# Patient Record
Sex: Female | Born: 1935
Health system: Southern US, Community
[De-identification: ages and names within clinical notes are randomized; demographics above are authoritative.]

## PROBLEM LIST (undated history)

## (undated) DIAGNOSIS — R000 Tachycardia, unspecified: Secondary | ICD-10-CM

## (undated) DIAGNOSIS — J309 Allergic rhinitis, unspecified: Secondary | ICD-10-CM

## (undated) DIAGNOSIS — C439 Malignant melanoma of skin, unspecified: Secondary | ICD-10-CM

## (undated) DIAGNOSIS — R55 Syncope and collapse: Secondary | ICD-10-CM

## (undated) DIAGNOSIS — E039 Hypothyroidism, unspecified: Secondary | ICD-10-CM

## (undated) HISTORY — DX: Malignant melanoma of skin, unspecified: C43.9

## (undated) HISTORY — DX: Hypothyroidism, unspecified: E03.9

## (undated) HISTORY — DX: Tachycardia, unspecified: R00.0

## (undated) HISTORY — DX: Allergic rhinitis, unspecified: J30.9

## (undated) HISTORY — DX: Syncope and collapse: R55

---

## 1995-06-29 DIAGNOSIS — C439 Malignant melanoma of skin, unspecified: Secondary | ICD-10-CM

## 1995-06-29 HISTORY — PX: MELANOMA EXCISION: SHX5266

## 1995-06-29 HISTORY — DX: Malignant melanoma of skin, unspecified: C43.9

## 2011-02-25 ENCOUNTER — Encounter: Payer: Self-pay | Admitting: Cardiovascular Disease

## 2011-08-19 DIAGNOSIS — R5383 Other fatigue: Secondary | ICD-10-CM | POA: Diagnosis not present

## 2011-08-19 DIAGNOSIS — I499 Cardiac arrhythmia, unspecified: Secondary | ICD-10-CM | POA: Diagnosis not present

## 2011-08-19 DIAGNOSIS — R5381 Other malaise: Secondary | ICD-10-CM | POA: Diagnosis not present

## 2011-08-19 DIAGNOSIS — M549 Dorsalgia, unspecified: Secondary | ICD-10-CM | POA: Diagnosis not present

## 2011-08-19 DIAGNOSIS — E039 Hypothyroidism, unspecified: Secondary | ICD-10-CM | POA: Diagnosis not present

## 2011-08-19 DIAGNOSIS — Z9181 History of falling: Secondary | ICD-10-CM | POA: Diagnosis not present

## 2011-08-20 ENCOUNTER — Encounter: Payer: Self-pay | Admitting: Cardiovascular Disease

## 2011-08-20 ENCOUNTER — Ambulatory Visit (INDEPENDENT_AMBULATORY_CARE_PROVIDER_SITE_OTHER): Payer: Medicare Other | Admitting: Cardiovascular Disease

## 2011-08-20 ENCOUNTER — Ambulatory Visit (HOSPITAL_COMMUNITY): Payer: Medicare Other | Attending: Cardiology

## 2011-08-20 ENCOUNTER — Other Ambulatory Visit: Payer: Self-pay

## 2011-08-20 VITALS — BP 144/66 | HR 104 | Ht 64.0 in | Wt 104.8 lb

## 2011-08-20 DIAGNOSIS — R000 Tachycardia, unspecified: Secondary | ICD-10-CM

## 2011-08-20 DIAGNOSIS — I379 Nonrheumatic pulmonary valve disorder, unspecified: Secondary | ICD-10-CM | POA: Insufficient documentation

## 2011-08-20 DIAGNOSIS — I319 Disease of pericardium, unspecified: Secondary | ICD-10-CM | POA: Insufficient documentation

## 2011-08-20 DIAGNOSIS — R55 Syncope and collapse: Secondary | ICD-10-CM

## 2011-08-20 DIAGNOSIS — I080 Rheumatic disorders of both mitral and aortic valves: Secondary | ICD-10-CM | POA: Insufficient documentation

## 2011-08-20 DIAGNOSIS — I079 Rheumatic tricuspid valve disease, unspecified: Secondary | ICD-10-CM | POA: Insufficient documentation

## 2011-08-20 DIAGNOSIS — I498 Other specified cardiac arrhythmias: Secondary | ICD-10-CM | POA: Diagnosis not present

## 2011-08-20 HISTORY — DX: Syncope and collapse: R55

## 2011-08-20 LAB — BASIC METABOLIC PANEL
BUN: 12 mg/dL (ref 6–23)
CO2: 26 mEq/L (ref 19–32)
Calcium: 8.8 mg/dL (ref 8.4–10.5)
Chloride: 102 mEq/L (ref 96–112)
Creatinine, Ser: 0.5 mg/dL (ref 0.4–1.2)
GFR: 114.47 mL/min (ref >60.00)
Glucose, Bld: 75 mg/dL (ref 70–99)
Potassium: 3.7 mEq/L (ref 3.5–5.1)
Sodium: 135 mEq/L (ref 135–145)

## 2011-08-20 LAB — TSH: TSH: 1.94 u[IU]/mL (ref 0.35–5.50)

## 2011-08-20 NOTE — Patient Instructions (Signed)
Your physician recommends that you schedule a follow-up appointment in: 3 months.  Labs today:  TSH, BMP  Your physician has requested that you have an echocardiogram. Echocardiography is a painless test that uses sound waves to create images of your heart. It provides your doctor with information about the size and shape of your heart and how well your heart's chambers and valves are working. This procedure takes approximately one hour. There are no restrictions for this procedure.  Your physician has recommended that you wear an event monitor. Event monitors are medical devices that record the heart's electrical activity. Doctors most often [place] these monitors to diagnose arrhythmias. Arrhythmias are problems with the speed or rhythm of the heartbeat. The monitor is a small, portable device. You can wear one while you do your normal daily activities. This is usually used to diagnose what is causing palpitations/syncope (passing out).

## 2011-08-20 NOTE — Assessment & Plan Note (Signed)
[name] presents today for further evaluation of a syncopal episode. She passed out while standing in the kitchen. Her symptoms do not sound consistent with orthostasis.  We'll check an echocardiogram on her for further evaluation. We'll check labs including a TSH and basic metabolic profile. We'll place a [employer] monitor on her.  I will see her in 3 months for followup visit.

## 2011-08-20 NOTE — Assessment & Plan Note (Signed)
Wilfred presents with a fast heart rate today. Have received some labs from her medical doctor's office. We will add a TSH to the labs. We'll also recheck a basic profile

## 2011-08-20 NOTE — Progress Notes (Signed)
    Rush Landmark Date of Birth  05/22/1926 [facility]     [place] [facility]  [address]    [address]   [address] [place], [place]  [address]    [place], [place]  [address] [phone]  Fax  [phone]  [phone]  Fax [phone]  Problem List: 1. Syncope  History of Present Illness:  Patricia Gilbert is an 78 yo  Who we are asked to see after having an episode of syncope. She denies any dyspnea or chest pain.  No hx of HTN.  She is very active - walks every day.   She had been standing for a while in the kitchen.  She has not have any problems since then.  Current Outpatient Prescriptions  Medication Sig Dispense Refill  . levothyroxine (SYNTHROID, LEVOTHROID) 50 MCG tablet Take 50 mcg by mouth daily.        Allergies  Allergen Reactions  . Aspirin     No past medical history on file.  Past Surgical History  Procedure Date  . Cesarean section     [id number]  . Melanoma excision 1997    History  Smoking status  . Never Smoker   Smokeless tobacco  . Not on file    History  Alcohol Use: Not on file    History reviewed. No pertinent family history.  Reviw of Systems:  Reviewed in the HPI.  All other systems are negative.  Physical Exam: Blood pressure 144/66, pulse 104, height 5\' 4"  (1.626 m), weight 104 lb 12.8 oz (47.537 kg). General: Well developed, well nourished, in no acute distress.  Head: Normocephalic, atraumatic, sclera non-icteric, mucus membranes are moist,   Neck: Supple. Carotids are 2 + without bruits. No JVD  Lungs: Clear bilaterally to auscultation.  Heart: regular rate  With normal  S1 S2. No murmurs, gallops or rubs.  Abdomen: Soft, non-tender, non-distended with normal bowel sounds. No hepatomegaly. No rebound/guarding. No masses.  Msk:  Strength and tone are normal  Extremities: No clubbing or cyanosis. No edema.  Distal pedal pulses are 2+ and equal bilaterally.  Neuro: Alert and oriented X 3. Moves all  extremities spontaneously.  Psych:  Responds to questions appropriately with a normal affect.  ECG: Normal sinus rhythm. She has occasional premature ventricular contractions. There is poor R-wave progression I suspect is due to lead placementx  Assessment / Plan:

## 2011-08-23 ENCOUNTER — Telehealth: Payer: Self-pay | Admitting: Cardiovascular Disease

## 2011-08-23 NOTE — Telephone Encounter (Signed)
New Msg: Pt calling wanting to speak with nurse/MD regarding results of pt lab results and ECHO.   Please return pt call to discuss further.

## 2011-08-23 NOTE — Telephone Encounter (Signed)
Pt called with echo and lab results. Pt verbalized understanding. Alfonso Ramus RN

## 2011-08-25 ENCOUNTER — Telehealth: Payer: Self-pay | Admitting: *Deleted

## 2011-08-25 NOTE — Telephone Encounter (Signed)
Patient called with echo results. Pt verbalized understanding. Jodette Onyekachi Gathright RN  

## 2011-09-18 DIAGNOSIS — R55 Syncope and collapse: Secondary | ICD-10-CM | POA: Diagnosis not present

## 2011-09-21 ENCOUNTER — Telehealth: Payer: Self-pay | Admitting: *Deleted

## 2011-09-21 NOTE — Telephone Encounter (Signed)
CALLED PT WITH ECARDIO RESULTS OF PAC/ PVC, NO REASONS TO EXPLAIN PRIOR SYNCOPAL EPISODE

## 2011-09-25 ENCOUNTER — Encounter: Payer: Self-pay | Admitting: Cardiovascular Disease

## 2011-10-19 ENCOUNTER — Telehealth: Payer: Self-pay | Admitting: Cardiovascular Disease

## 2011-10-19 NOTE — Telephone Encounter (Signed)
ROI Mailed to Pt  10/19/11/KM

## 2011-10-26 ENCOUNTER — Telehealth: Payer: Self-pay | Admitting: Cardiovascular Disease

## 2011-10-26 ENCOUNTER — Telehealth: Payer: Self-pay | Admitting: *Deleted

## 2011-10-26 NOTE — Telephone Encounter (Signed)
Pt walked in thinking she had app for a treadmill, I reviewed chart and see where originally there was an order for a monitor placed under the treadmill heading but an ecardio monitor was placed the day of app. Pt has not had any more syncopal episodes and has been feeling fine, ecardio showed PAC/ PVC.  I apologized for confusion and the long trip in from [place], pt denied need for sooner app with dr Elease Hashimoto, told to call if further episodes and I will get her in sooner than her scheduled 3 month app, pt agreed to plan.

## 2011-10-26 NOTE — Telephone Encounter (Signed)
Patient signed authorization form. Gave her labs.4/30 emg

## 2011-11-25 ENCOUNTER — Encounter: Payer: Self-pay | Admitting: Cardiovascular Disease

## 2011-11-25 ENCOUNTER — Ambulatory Visit (INDEPENDENT_AMBULATORY_CARE_PROVIDER_SITE_OTHER): Payer: Medicare Other | Admitting: Cardiovascular Disease

## 2011-11-25 VITALS — BP 130/56 | HR 80 | Ht 64.0 in | Wt 104.8 lb

## 2011-11-25 DIAGNOSIS — I498 Other specified cardiac arrhythmias: Secondary | ICD-10-CM | POA: Diagnosis not present

## 2011-11-25 DIAGNOSIS — R55 Syncope and collapse: Secondary | ICD-10-CM

## 2011-11-25 DIAGNOSIS — R000 Tachycardia, unspecified: Secondary | ICD-10-CM

## 2011-11-25 NOTE — Patient Instructions (Signed)
Your physician recommends that you schedule a follow-up appointment in: as needed basis, please follow up with Dr Felicity Coyer. Call any time you need further cardiac care, thank you!

## 2011-11-25 NOTE — Progress Notes (Signed)
    Rush Landmark Date of Birth  December 14, 1925 [facility]     [place] [facility]  [address]    [address]   [address] [place], [place]  [address]    [place], [place]  [address] [phone]  Fax  [phone]  [phone]  Fax [phone]  Problem List: 1. Syncope  History of Present Illness:  Patricia Gilbert is an 78 yo  Who we are asked to see after having an episode of syncope. She denies any dyspnea or chest pain.  No hx of HTN.  She is very active - walks every day.   She had been standing for a while in the kitchen.  She has not have any problems since then.  An echo revealed:  Left ventricle: The cavity size was normal. Wall thickness was normal. Systolic function was normal. The estimated ejection fraction was in the range of 55% to 60%. Wall motion was normal; there were no regional wall motion abnormalities. Doppler parameters are consistent with abnormal left ventricular relaxation (grade 1 diastolic dysfunction). - Aortic valve: Mild regurgitation. - Mitral valve: Mild prolapse, involving the posterior leaflet. Mild regurgitation. - Tricuspid valve: Moderate regurgitation. - Pulmonary arteries: Systolic pressure was mildly increased. PA peak pressure: 42mm Hg (S). - Pericardium, extracardiac: A small pericardial effusion was identified.  She walks 2 miles a day.   She's not had any other problems. She denies any further episodes of syncope. She is scheduled to see Dr. Felicity Coyer soon.   Current Outpatient Prescriptions  Medication Sig Dispense Refill  . levothyroxine (SYNTHROID, LEVOTHROID) 50 MCG tablet Take 50 mcg by mouth daily.        Allergies  Allergen Reactions  . Aspirin     No past medical history on file.  Past Surgical History  Procedure Date  . Cesarean section     [id number]  . Melanoma excision 1997    History  Smoking status  . Never Smoker   Smokeless tobacco  . Not on file    History  Alcohol Use: Not on file     No family history on file.  Reviw of Systems:  Reviewed in the HPI.  All other systems are negative.  Physical Exam: Blood pressure 130/56, pulse 80, height 5\' 4"  (1.626 m), weight 104 lb 12.8 oz (47.537 kg). General: Well developed, well nourished, in no acute distress.  Head: Normocephalic, atraumatic, sclera non-icteric, mucus membranes are moist,   Neck: Supple. Carotids are 2 + without bruits. No JVD  Lungs: Clear bilaterally to auscultation.  Heart: regular rate  With normal  S1 S2. No murmurs, gallops or rubs.  Abdomen: Soft, non-tender, non-distended with normal bowel sounds. No hepatomegaly. No rebound/guarding. No masses.  Msk:  Strength and tone are normal  Extremities: No clubbing or cyanosis. No edema.  Distal pedal pulses are 2+ and equal bilaterally.  Neuro: Alert and oriented X 3. Moves all extremities spontaneously.  Psych:  Responds to questions appropriately with a normal affect.  ECG:  Assessment / Plan:

## 2011-11-25 NOTE — Assessment & Plan Note (Signed)
Her heart rate is 80. We'll continue with her same medications. I'll see her on an as-needed basis.

## 2011-11-25 NOTE — Assessment & Plan Note (Signed)
Her echocardiogram was normal. She's not had any specific arrhythmias. We will see her on an as-needed basis. She'll be seeing Dr. Felicity Coyer and can see [place] on an as needed basis.

## 2011-11-27 ENCOUNTER — Encounter: Payer: Self-pay | Admitting: Cardiovascular Disease

## 2011-12-06 DIAGNOSIS — Z85828 Personal history of other malignant neoplasm of skin: Secondary | ICD-10-CM | POA: Diagnosis not present

## 2011-12-06 DIAGNOSIS — L570 Actinic keratosis: Secondary | ICD-10-CM | POA: Diagnosis not present

## 2011-12-06 DIAGNOSIS — D485 Neoplasm of uncertain behavior of skin: Secondary | ICD-10-CM | POA: Diagnosis not present

## 2011-12-06 DIAGNOSIS — Z85820 Personal history of malignant melanoma of skin: Secondary | ICD-10-CM | POA: Diagnosis not present

## 2011-12-24 ENCOUNTER — Encounter: Payer: Self-pay | Admitting: Internal Medicine

## 2011-12-24 ENCOUNTER — Ambulatory Visit (INDEPENDENT_AMBULATORY_CARE_PROVIDER_SITE_OTHER): Payer: Medicare Other | Admitting: Internal Medicine

## 2011-12-24 VITALS — BP 120/62 | HR 77 | Temp 98.1°F | Ht 64.0 in | Wt 103.8 lb

## 2011-12-24 DIAGNOSIS — Z0000 Encounter for general adult medical examination without abnormal findings: Secondary | ICD-10-CM

## 2011-12-24 DIAGNOSIS — E039 Hypothyroidism, unspecified: Secondary | ICD-10-CM | POA: Diagnosis not present

## 2011-12-24 DIAGNOSIS — F0390 Unspecified dementia without behavioral disturbance: Secondary | ICD-10-CM

## 2011-12-24 DIAGNOSIS — J309 Allergic rhinitis, unspecified: Secondary | ICD-10-CM | POA: Insufficient documentation

## 2011-12-24 MED ORDER — LEVOTHYROXINE SODIUM 50 MCG PO TABS: 50.0000 ug | ORAL_TABLET | Freq: Every day | ORAL | Status: DC

## 2011-12-24 MED ORDER — CALCIUM CARBONATE ANTACID 500 MG PO CHEW: 2.0000 | CHEWABLE_TABLET | Freq: Two times a day (BID) | ORAL | Status: AC

## 2011-12-24 MED ORDER — VITAMIN D 1000 UNITS PO TABS: 1000.0000 [IU] | ORAL_TABLET | Freq: Every day | ORAL | Status: AC

## 2011-12-24 NOTE — Progress Notes (Signed)
Subjective:    Patient ID: Patricia Gilbert, female    DOB: 02-03-1926, 78 y.o.   MRN: [id number]  HPI  New pt to me and our division, here to establish care Describes herself as a "health nut" - walks 36miles/day and active with bridge club, painting (art)   Also here for medicare wellness  Diet: heart healthy Physical activity: WB/active walking daily Depression/mood screen: negative Hearing: intact to whispered voice Visual acuity: grossly normal, performs annual eye exam  ADLs: capable Fall risk: none Home safety: good Cognitive evaluation: intact to orientation, naming, and repetition - poor recall EOL planning: adv directives, full code/ I agree  I have personally reviewed and have noted 1. The patient's medical and social history 2. Their use of alcohol, tobacco or illicit drugs 3. Their current medications and supplements 4. The patient's functional ability including ADL's, fall risks, home safety risks and hearing or visual impairment. 5. Diet and physical activities 6. Evidence for depression or mood disorders  Reviewed chronic medical issues:  Hypothyroid - the patient reports compliance with medication(s) as prescribed. Denies adverse side effects.  Past Medical History  Diagnosis Date  . Sinus tachycardia   . Syncope 08/20/2011    single event, neg echo and cards eval   . Allergic rhinitis, cause unspecified   . Hypothyroid   . Melanoma 1997   Family History  Problem Relation Age of Onset  . Prostate cancer Father   . Diabetes Other   . Stroke Other    History  Substance Use Topics  . Smoking status: Never Smoker   . Smokeless tobacco: Not on file  . Alcohol Use: Yes     Review of Systems Constitutional: Negative for fever or weight change.  Respiratory: Negative for cough and shortness of breath.   Cardiovascular: Negative for chest pain or palpitations.  Gastrointestinal: Negative for abdominal pain, no bowel changes.  Musculoskeletal:  Negative for gait problem or joint swelling.  Skin: Negative for rash.  Neurological: Negative for dizziness or headache. mild memory problems No other specific complaints in a complete review of systems (except as listed in HPI above).     Objective:   Physical Exam BP 120/62  Pulse 77  Temp 98.1 F (36.7 C) (Oral)  Ht 5\' 4"  (1.626 m)  Wt 103 lb 12.8 oz (47.083 kg)  BMI 17.82 kg/m2  SpO2 96% Wt Readings from Last 3 Encounters:  12/24/11 103 lb 12.8 oz (47.083 kg)  11/25/11 104 lb 12.8 oz (47.537 kg)  08/20/11 104 lb 12.8 oz (47.537 kg)   Constitutional: She appears well-developed and well-nourished. No distress.  HENT: Head: Normocephalic and atraumatic. Ears: B TMs ok, no erythema or effusion; Nose: Nose normal. Mouth/Throat: Oropharynx is clear and moist. No oropharyngeal exudate.  Eyes: wears corrective lenses. Conjunctivae and EOM are normal. Pupils are equal, round, and reactive to light. No scleral icterus.  Neck: Normal range of motion. Neck supple. No JVD present. No thyromegaly present.  Cardiovascular: Normal rate, regular rhythm and normal heart sounds.  No murmur heard. No BLE edema. Pulmonary/Chest: Effort normal and breath sounds normal. No respiratory distress. She has no wheezes.  Musculoskeletal: Normal range of motion, no joint effusions. No gross deformities Neurological: Mild head/neck tremor (ET). She is alert and oriented to person, place, and time. Mild short term memory deficits noted (MSE 28/30). No cranial nerve deficit. Coordination, articulation and speech content normal.  Skin: well healed excision on RLE from prior melanoma - Skin is warm and  dry. No rash noted. No erythema.  Psychiatric: She has a normal mood and affect. Her behavior is normal. Judgment and thought content normal.   Lab Results  Component Value Date   GLUCOSE 75 08/20/2011   NA 135 08/20/2011   K 3.7 08/20/2011   CL 102 08/20/2011   CREATININE 0.5 08/20/2011   BUN 12 08/20/2011   CO2  26 08/20/2011   TSH 1.94 08/20/2011       Assessment & Plan:  AWV/v70.0 - Today patient counseled on age appropriate routine health concerns for screening and prevention, each reviewed and up to date or declined. Immunizations reviewed and up to date or declined. Labs/ECG from 07/2011 reviewed. Risk factors for depression reviewed and negative. Hearing function and visual acuity are intact. ADLs screened and addressed as needed. Functional ability and level of safety reviewed and appropriate. Education, counseling and referrals performed based on assessed risks today. Patient provided with a copy of personalized plan for preventive services.

## 2011-12-24 NOTE — Assessment & Plan Note (Signed)
Lab Results  Component Value Date   TSH 1.94 08/20/2011   The current medical regimen is effective;  continue present plan and medications.

## 2011-12-24 NOTE — Patient Instructions (Addendum)
It was good to see you today. We have reviewed your prior records including labs and tests today Medications reviewed, no changes at this time. Refill on medication(s) as discussed today. Take 1 can Boost each day in addition to your regular meals Take Tums for calcium and Vitamin D as discussed Health Maintenance reviewed - all recommended immunizations and age-appropriate screenings are up-to-date or declined.  Please schedule followup in 1 year for thyroid check, call sooner if problems.

## 2011-12-24 NOTE — Assessment & Plan Note (Signed)
Evident on exam and MSE today (28/30) Discussed option of med tx to slow process and pt declines same Pt declines med eval for reversible causes -  Will monitor and provide support ongoing

## 2012-05-10 DIAGNOSIS — Z9189 Other specified personal risk factors, not elsewhere classified: Secondary | ICD-10-CM | POA: Diagnosis not present

## 2012-05-10 DIAGNOSIS — C44320 Squamous cell carcinoma of skin of unspecified parts of face: Secondary | ICD-10-CM | POA: Diagnosis not present

## 2012-05-10 DIAGNOSIS — D485 Neoplasm of uncertain behavior of skin: Secondary | ICD-10-CM | POA: Diagnosis not present

## 2012-05-10 DIAGNOSIS — Z85820 Personal history of malignant melanoma of skin: Secondary | ICD-10-CM | POA: Diagnosis not present

## 2012-05-10 DIAGNOSIS — D0439 Carcinoma in situ of skin of other parts of face: Secondary | ICD-10-CM | POA: Diagnosis not present

## 2012-05-10 DIAGNOSIS — L570 Actinic keratosis: Secondary | ICD-10-CM | POA: Diagnosis not present

## 2012-05-10 DIAGNOSIS — Z85828 Personal history of other malignant neoplasm of skin: Secondary | ICD-10-CM | POA: Diagnosis not present

## 2012-05-29 ENCOUNTER — Other Ambulatory Visit: Payer: Self-pay | Admitting: *Deleted

## 2012-05-29 MED ORDER — LEVOTHYROXINE SODIUM 50 MCG PO TABS: 50.0000 ug | ORAL_TABLET | Freq: Every day | ORAL | Status: DC

## 2012-05-29 NOTE — Telephone Encounter (Signed)
R'cd fax from [facility] for refill of Synthroid.

## 2012-06-01 DIAGNOSIS — D0439 Carcinoma in situ of skin of other parts of face: Secondary | ICD-10-CM | POA: Diagnosis not present

## 2012-06-01 DIAGNOSIS — C44320 Squamous cell carcinoma of skin of unspecified parts of face: Secondary | ICD-10-CM | POA: Diagnosis not present

## 2012-12-27 ENCOUNTER — Other Ambulatory Visit: Payer: Self-pay | Admitting: *Deleted

## 2012-12-27 NOTE — Telephone Encounter (Signed)
Pt called in for a refill on Levothyroxine, advised appoint needed in order to refill medication.  Appointment made for 7.9.14.

## 2013-01-03 ENCOUNTER — Ambulatory Visit (INDEPENDENT_AMBULATORY_CARE_PROVIDER_SITE_OTHER)
Admission: RE | Admit: 2013-01-03 | Discharge: 2013-01-03 | Disposition: A | Discharge status: Home / Self Care | Payer: Medicare Other | Source: Ambulatory Visit | Attending: Internal Medicine | Admitting: Internal Medicine

## 2013-01-03 ENCOUNTER — Ambulatory Visit (INDEPENDENT_AMBULATORY_CARE_PROVIDER_SITE_OTHER): Payer: Medicare Other | Admitting: Internal Medicine

## 2013-01-03 ENCOUNTER — Encounter: Payer: Self-pay | Admitting: Internal Medicine

## 2013-01-03 ENCOUNTER — Other Ambulatory Visit (INDEPENDENT_AMBULATORY_CARE_PROVIDER_SITE_OTHER): Payer: Medicare Other

## 2013-01-03 VITALS — BP 124/72 | HR 87 | Temp 97.6°F | Wt 119.0 lb

## 2013-01-03 DIAGNOSIS — Z13820 Encounter for screening for osteoporosis: Secondary | ICD-10-CM | POA: Diagnosis not present

## 2013-01-03 DIAGNOSIS — M810 Age-related osteoporosis without current pathological fracture: Secondary | ICD-10-CM

## 2013-01-03 DIAGNOSIS — E039 Hypothyroidism, unspecified: Secondary | ICD-10-CM

## 2013-01-03 DIAGNOSIS — Z0000 Encounter for general adult medical examination without abnormal findings: Secondary | ICD-10-CM

## 2013-01-03 LAB — LIPID PANEL
HDL: 75 mg/dL (ref >39.00)
Triglycerides: 87 mg/dL (ref 0.0–149.0)
VLDL: 17.4 mg/dL (ref 0.0–40.0)

## 2013-01-03 MED ORDER — LEVOTHYROXINE SODIUM 50 MCG PO TABS: 50.0000 ug | ORAL_TABLET | Freq: Every day | ORAL | Status: DC

## 2013-01-03 NOTE — Patient Instructions (Signed)
It was good to see you today. We have reviewed your prior records including labs and tests today Health Maintenance reviewed - will arrange for DEXA to look at bone density - all other recommended immunizations and age-appropriate screenings are up-to-date. Test(s) ordered today. Your results will be released to MyChart (or called to you) after review, usually within 72hours after test completion. If any changes need to be made, you will be notified at that same time. Medications reviewed, no changes at this time. Refill on medication(s) as discussed today.  Please schedule followup in 1 year for annual wellness visit and thyroid check, call sooner if problems.   Health Maintenance, Females A healthy lifestyle and preventative care can promote health and wellness.  Maintain regular health, dental, and eye exams.  Eat a healthy diet. Foods like vegetables, fruits, whole grains, low-fat dairy products, and lean protein foods contain the nutrients you need without too many calories. Decrease your intake of foods high in solid fats, added sugars, and salt. Get information about a proper diet from your caregiver, if necessary.  Regular physical exercise is one of the most important things you can do for your health. Most adults should get at least 150 minutes of moderate-intensity exercise (any activity that increases your heart rate and causes you to sweat) each week. In addition, most adults need muscle-strengthening exercises on 2 or more days a week.   Maintain a healthy weight. The body mass index (BMI) is a screening tool to identify possible weight problems. It provides an estimate of body fat based on height and weight. Your caregiver can help determine your BMI, and can help you achieve or maintain a healthy weight. For adults 20 years and older:  A BMI below 18.5 is considered underweight.  A BMI of 18.5 to 24.9 is normal.  A BMI of 25 to 29.9 is considered overweight.  A BMI of 30 and  above is considered obese.  Maintain normal blood lipids and cholesterol by exercising and minimizing your intake of saturated fat. Eat a balanced diet with plenty of fruits and vegetables. Blood tests for lipids and cholesterol should begin at age 13 and be repeated every 5 years. If your lipid or cholesterol levels are high, you are over 50, or you are a high risk for heart disease, you may need your cholesterol levels checked more frequently.Ongoing high lipid and cholesterol levels should be treated with medicines if diet and exercise are not effective.  If you smoke, find out from your caregiver how to quit. If you do not use tobacco, do not start.  If you are pregnant, do not drink alcohol. If you are breastfeeding, be very cautious about drinking alcohol. If you are not pregnant and choose to drink alcohol, do not exceed 1 drink per day. One drink is considered to be 12 ounces (355 mL) of beer, 5 ounces (148 mL) of wine, or 1.5 ounces (44 mL) of liquor.  Avoid use of street drugs. Do not share needles with anyone. Ask for help if you need support or instructions about stopping the use of drugs.  High blood pressure causes heart disease and increases the risk of stroke. Blood pressure should be checked at least every 1 to 2 years. Ongoing high blood pressure should be treated with medicines, if weight loss and exercise are not effective.  If you are 89 to 77 years old, ask your caregiver if you should take aspirin to prevent strokes.  Diabetes screening involves taking a  blood sample to check your fasting blood sugar level. This should be done once every 3 years, after age 72, if you are within normal weight and without risk factors for diabetes. Testing should be considered at a younger age or be carried out more frequently if you are overweight and have at least 1 risk factor for diabetes.  Breast cancer screening is essential preventative care for women. You should practice "breast  self-awareness." This means understanding the normal appearance and feel of your breasts and may include breast self-examination. Any changes detected, no matter how small, should be reported to a caregiver. Women in their 27s and 30s should have a clinical breast exam (CBE) by a caregiver as part of a regular health exam every 1 to 3 years. After age 64, women should have a CBE every year. Starting at age 82, women should consider having a mammogram (breast X-ray) every year. Women who have a family history of breast cancer should talk to their caregiver about genetic screening. Women at a high risk of breast cancer should talk to their caregiver about having an MRI and a mammogram every year.  The Pap test is a screening test for cervical cancer. Women should have a Pap test starting at age 82. Between ages 59 and 67, Pap tests should be repeated every 2 years. Beginning at age 65, you should have a Pap test every 3 years as long as the past 3 Pap tests have been normal. If you had a hysterectomy for a problem that was not cancer or a condition that could lead to cancer, then you no longer need Pap tests. If you are between ages 39 and 24, and you have had normal Pap tests going back 10 years, you no longer need Pap tests. If you have had past treatment for cervical cancer or a condition that could lead to cancer, you need Pap tests and screening for cancer for at least 20 years after your treatment. If Pap tests have been discontinued, risk factors (such as a new sexual partner) need to be reassessed to determine if screening should be resumed. Some women have medical problems that increase the chance of getting cervical cancer. In these cases, your caregiver may recommend more frequent screening and Pap tests.  The human papillomavirus (HPV) test is an additional test that may be used for cervical cancer screening. The HPV test looks for the virus that can cause the cell changes on the cervix. The cells  collected during the Pap test can be tested for HPV. The HPV test could be used to screen women aged 42 years and older, and should be used in women of any age who have unclear Pap test results. After the age of 44, women should have HPV testing at the same frequency as a Pap test.  Colorectal cancer can be detected and often prevented. Most routine colorectal cancer screening begins at the age of 40 and continues through age 46. However, your caregiver may recommend screening at an earlier age if you have risk factors for colon cancer. On a yearly basis, your caregiver may provide home test kits to check for hidden blood in the stool. Use of a small camera at the end of a tube, to directly examine the colon (sigmoidoscopy or colonoscopy), can detect the earliest forms of colorectal cancer. Talk to your caregiver about this at age 35, when routine screening begins. Direct examination of the colon should be repeated every 5 to 10 years through age  75, unless early forms of pre-cancerous polyps or small growths are found.  Hepatitis C blood testing is recommended for all people born from [birth year] through 1963 and any individual with known risks for hepatitis C.  Practice safe sex. Use condoms and avoid high-risk sexual practices to reduce the spread of sexually transmitted infections (STIs). Sexually active women aged 48 and younger should be checked for Chlamydia, which is a common sexually transmitted infection. Older women with new or multiple partners should also be tested for Chlamydia. Testing for other STIs is recommended if you are sexually active and at increased risk.  Osteoporosis is a disease in which the bones lose minerals and strength with aging. This can result in serious bone fractures. The risk of osteoporosis can be identified using a bone density scan. Women ages 60 and over and women at risk for fractures or osteoporosis should discuss screening with their caregivers. Ask your caregiver  whether you should be taking a calcium supplement or vitamin D to reduce the rate of osteoporosis.  Menopause can be associated with physical symptoms and risks. Hormone replacement therapy is available to decrease symptoms and risks. You should talk to your caregiver about whether hormone replacement therapy is right for you.  Use sunscreen with a sun protection factor (SPF) of 30 or greater. Apply sunscreen liberally and repeatedly throughout the day. You should seek shade when your shadow is shorter than you. Protect yourself by wearing long sleeves, pants, a wide-brimmed hat, and sunglasses year round, whenever you are outdoors.  Notify your caregiver of new moles or changes in moles, especially if there is a change in shape or color. Also notify your caregiver if a mole is larger than the size of a pencil eraser.  Stay current with your immunizations. Document Released: 12/28/2010 Document Revised: 09/06/2011 Document Reviewed: 12/28/2010 [facility] Patient Information 2014 [place], [place].

## 2013-01-03 NOTE — Assessment & Plan Note (Signed)
Lab Results  Component Value Date   TSH 1.94 08/20/2011   The current medical regimen is effective;  continue present plan and medications. Check annual TSH with lipids and DEXA due to increased risk comorbid dz

## 2013-01-03 NOTE — Progress Notes (Signed)
Subjective:    Patient ID: Patricia Gilbert, female    DOB: [birth year], 79 y.o.   MRN: [id number]  HPI  here for medicare wellness  Diet: heart healthy Physical activity: WB/active walking daily Depression/mood screen: negative Hearing: intact to whispered voice Visual acuity: grossly normal, performs annual eye exam  ADLs: capable Fall risk: none Home safety: good Cognitive evaluation: intact to orientation, naming, and repetition - poor recall EOL planning: adv directives, full code/ I agree  I have personally reviewed and have noted 1. The patient's medical and social history 2. Their use of alcohol, tobacco or illicit drugs 3. Their current medications and supplements 4. The patient's functional ability including ADL's, fall risks, home safety risks and hearing or visual impairment. 5. Diet and physical activities 6. Evidence for depression or mood disorders  Reviewed chronic medical issues:  Hypothyroid - the patient reports compliance with medication(s) as prescribed. Denies adverse side effects.  Past Medical History  Diagnosis Date  . Sinus tachycardia   . Syncope 08/20/2011    single event, neg echo and cards eval   . Allergic rhinitis, cause unspecified   . Hypothyroid   . Melanoma 1997   Family History  Problem Relation Age of Onset  . Prostate cancer Father   . Diabetes Other   . Stroke Other    History  Substance Use Topics  . Smoking status: Never Smoker   . Smokeless tobacco: Not on file  . Alcohol Use: Yes   Describes herself as a "health nut" - walks 96miles/day and active with bridge club, painting (art)  Review of Systems  Constitutional: Negative for fever or weight change.  Respiratory: Negative for cough and shortness of breath.   Cardiovascular: Negative for chest pain or palpitations.  Gastrointestinal: Negative for abdominal pain, no bowel changes.  Musculoskeletal: Negative for gait problem or joint swelling.  Skin: Negative  for rash.  Neurological: Negative for dizziness or headache. mild memory problems (chronic) No other specific complaints in a complete review of systems (except as listed in HPI above).     Objective:   Physical Exam  BP 124/72  Pulse 87  Temp(Src) 97.6 F (36.4 C) (Oral)  Wt 119 lb (53.978 kg)  BMI 20.42 kg/m2  SpO2 96% Wt Readings from Last 3 Encounters:  01/03/13 119 lb (53.978 kg)  12/24/11 103 lb 12.8 oz (47.083 kg)  11/25/11 104 lb 12.8 oz (47.537 kg)   Constitutional: She appears well-developed and well-nourished. No distress.  HENT: Head: Normocephalic and atraumatic. Ears: B TMs ok, no erythema or effusion; Nose: Nose normal. Mouth/Throat: Oropharynx is clear and moist. No oropharyngeal exudate.  Eyes: wears corrective lenses. Conjunctivae and EOM are normal. Pupils are equal, round, and reactive to light. No scleral icterus.  Neck: Normal range of motion. Neck supple. No JVD present. No thyromegaly present.  Cardiovascular: Normal rate, regular rhythm and normal heart sounds.  No murmur heard. No BLE edema. Pulmonary/Chest: Effort normal and breath sounds normal. No respiratory distress. She has no wheezes.  Musculoskeletal: Normal range of motion, no joint effusions. No gross deformities Neurological: Mild head/neck tremor (ET). She is alert and oriented to person, place, and time. Mild short term memory deficits noted (MSE 28/30). No cranial nerve deficit. Coordination, articulation and speech content normal.  Skin: well healed excision on RLE from prior melanoma - Skin is warm and dry. No rash noted. No erythema.  Psychiatric: She has a normal mood and affect. Her behavior is normal. Judgment and thought  content normal.   Lab Results  Component Value Date   GLUCOSE 75 08/20/2011   NA 135 08/20/2011   K 3.7 08/20/2011   CL 102 08/20/2011   CREATININE 0.5 08/20/2011   BUN 12 08/20/2011   CO2 26 08/20/2011   TSH 1.94 08/20/2011       Assessment & Plan:  AWV/v70.0 -  Today patient counseled on age appropriate routine health concerns for screening and prevention, each reviewed and up to date or declined. Immunizations reviewed and up to date or declined. Labs ordered and reviewed. Risk factors for depression reviewed and negative. Hearing function and visual acuity are intact. ADLs screened and addressed as needed. Functional ability and level of safety reviewed and appropriate. Education, counseling and referrals performed based on assessed risks today. Patient provided with a copy of personalized plan for preventive services.

## 2013-01-09 ENCOUNTER — Other Ambulatory Visit: Payer: Self-pay | Admitting: *Deleted

## 2013-01-09 ENCOUNTER — Encounter: Payer: Self-pay | Admitting: Internal Medicine

## 2013-01-09 DIAGNOSIS — M810 Age-related osteoporosis without current pathological fracture: Secondary | ICD-10-CM | POA: Insufficient documentation

## 2013-01-09 MED ORDER — ALENDRONATE SODIUM 70 MG PO TABS: 70.0000 mg | ORAL_TABLET | ORAL | Status: DC

## 2013-01-09 NOTE — Telephone Encounter (Signed)
Called pt with bone density results. MD is wanting pt to start fosamax. [facility]...lmb

## 2013-11-27 ENCOUNTER — Other Ambulatory Visit: Payer: Self-pay | Admitting: *Deleted

## 2013-11-27 MED ORDER — LEVOTHYROXINE SODIUM 50 MCG PO TABS: 50.0000 ug | ORAL_TABLET | Freq: Every day | ORAL | Status: DC

## 2014-04-01 DIAGNOSIS — M9905 Segmental and somatic dysfunction of pelvic region: Secondary | ICD-10-CM | POA: Diagnosis not present

## 2014-04-01 DIAGNOSIS — M791 Myalgia: Secondary | ICD-10-CM | POA: Diagnosis not present

## 2014-04-02 DIAGNOSIS — M791 Myalgia: Secondary | ICD-10-CM | POA: Diagnosis not present

## 2014-04-02 DIAGNOSIS — M9905 Segmental and somatic dysfunction of pelvic region: Secondary | ICD-10-CM | POA: Diagnosis not present

## 2014-04-04 DIAGNOSIS — M791 Myalgia: Secondary | ICD-10-CM | POA: Diagnosis not present

## 2014-04-04 DIAGNOSIS — M9905 Segmental and somatic dysfunction of pelvic region: Secondary | ICD-10-CM | POA: Diagnosis not present

## 2014-04-08 DIAGNOSIS — M9905 Segmental and somatic dysfunction of pelvic region: Secondary | ICD-10-CM | POA: Diagnosis not present

## 2014-04-08 DIAGNOSIS — M791 Myalgia: Secondary | ICD-10-CM | POA: Diagnosis not present

## 2014-04-11 DIAGNOSIS — M9905 Segmental and somatic dysfunction of pelvic region: Secondary | ICD-10-CM | POA: Diagnosis not present

## 2014-04-11 DIAGNOSIS — M791 Myalgia: Secondary | ICD-10-CM | POA: Diagnosis not present

## 2014-04-15 DIAGNOSIS — M791 Myalgia: Secondary | ICD-10-CM | POA: Diagnosis not present

## 2014-04-15 DIAGNOSIS — M9905 Segmental and somatic dysfunction of pelvic region: Secondary | ICD-10-CM | POA: Diagnosis not present

## 2014-04-18 DIAGNOSIS — M791 Myalgia: Secondary | ICD-10-CM | POA: Diagnosis not present

## 2014-04-18 DIAGNOSIS — M9905 Segmental and somatic dysfunction of pelvic region: Secondary | ICD-10-CM | POA: Diagnosis not present

## 2014-04-19 ENCOUNTER — Other Ambulatory Visit: Payer: Self-pay | Admitting: Internal Medicine

## 2014-04-22 DIAGNOSIS — M9905 Segmental and somatic dysfunction of pelvic region: Secondary | ICD-10-CM | POA: Diagnosis not present

## 2014-04-22 DIAGNOSIS — M545 Low back pain: Secondary | ICD-10-CM | POA: Diagnosis not present

## 2014-04-23 DIAGNOSIS — M545 Low back pain: Secondary | ICD-10-CM | POA: Diagnosis not present

## 2014-04-23 DIAGNOSIS — M9905 Segmental and somatic dysfunction of pelvic region: Secondary | ICD-10-CM | POA: Diagnosis not present

## 2014-04-25 DIAGNOSIS — M9905 Segmental and somatic dysfunction of pelvic region: Secondary | ICD-10-CM | POA: Diagnosis not present

## 2014-04-25 DIAGNOSIS — M545 Low back pain: Secondary | ICD-10-CM | POA: Diagnosis not present

## 2014-04-26 DIAGNOSIS — M9905 Segmental and somatic dysfunction of pelvic region: Secondary | ICD-10-CM | POA: Diagnosis not present

## 2014-04-26 DIAGNOSIS — M545 Low back pain: Secondary | ICD-10-CM | POA: Diagnosis not present

## 2014-04-30 DIAGNOSIS — M545 Low back pain: Secondary | ICD-10-CM | POA: Diagnosis not present

## 2014-04-30 DIAGNOSIS — M9905 Segmental and somatic dysfunction of pelvic region: Secondary | ICD-10-CM | POA: Diagnosis not present

## 2014-05-06 DIAGNOSIS — M545 Low back pain: Secondary | ICD-10-CM | POA: Diagnosis not present

## 2014-05-06 DIAGNOSIS — M9905 Segmental and somatic dysfunction of pelvic region: Secondary | ICD-10-CM | POA: Diagnosis not present

## 2014-05-09 ENCOUNTER — Other Ambulatory Visit: Payer: Self-pay

## 2014-05-09 ENCOUNTER — Telehealth: Payer: Self-pay | Admitting: Internal Medicine

## 2014-05-09 ENCOUNTER — Other Ambulatory Visit: Payer: Self-pay | Admitting: Internal Medicine

## 2014-05-09 MED ORDER — LEVOTHYROXINE SODIUM 50 MCG PO TABS: 50.0000 ug | ORAL_TABLET | Freq: Every day | ORAL | Status: AC

## 2014-05-09 NOTE — Telephone Encounter (Signed)
erx done

## 2014-05-09 NOTE — Telephone Encounter (Signed)
Requesting levothyroxine go to [facility] and in turn they deliver to patient, request in their same day from [place].

## 2014-05-21 DIAGNOSIS — D485 Neoplasm of uncertain behavior of skin: Secondary | ICD-10-CM | POA: Diagnosis not present

## 2014-05-21 DIAGNOSIS — C4330 Malignant melanoma of unspecified part of face: Secondary | ICD-10-CM | POA: Diagnosis not present

## 2014-05-21 DIAGNOSIS — Z85828 Personal history of other malignant neoplasm of skin: Secondary | ICD-10-CM | POA: Diagnosis not present

## 2014-05-31 DIAGNOSIS — C4330 Malignant melanoma of unspecified part of face: Secondary | ICD-10-CM | POA: Diagnosis not present

## 2014-05-31 DIAGNOSIS — C4339 Malignant melanoma of other parts of face: Secondary | ICD-10-CM | POA: Diagnosis not present

## 2014-06-03 DIAGNOSIS — C4330 Malignant melanoma of unspecified part of face: Secondary | ICD-10-CM | POA: Diagnosis not present

## 2014-06-03 DIAGNOSIS — Z01810 Encounter for preprocedural cardiovascular examination: Secondary | ICD-10-CM | POA: Diagnosis not present

## 2014-06-10 DIAGNOSIS — E039 Hypothyroidism, unspecified: Secondary | ICD-10-CM | POA: Diagnosis not present

## 2014-06-10 DIAGNOSIS — D0339 Melanoma in situ of other parts of face: Secondary | ICD-10-CM | POA: Diagnosis not present

## 2014-06-10 DIAGNOSIS — C4339 Malignant melanoma of other parts of face: Secondary | ICD-10-CM | POA: Diagnosis not present

## 2014-07-12 DIAGNOSIS — C4339 Malignant melanoma of other parts of face: Secondary | ICD-10-CM | POA: Diagnosis not present

## 2015-03-01 ENCOUNTER — Emergency Department (HOSPITAL_COMMUNITY): Admission: EM | Admit: 2015-03-01 | Discharge: 2015-03-01 | Discharge status: Absent without leave | Payer: Self-pay

## 2015-03-01 ENCOUNTER — Encounter (HOSPITAL_COMMUNITY): Payer: Self-pay | Admitting: Emergency Medicine

## 2015-03-01 ENCOUNTER — Emergency Department (HOSPITAL_COMMUNITY)
Admission: EM | Admit: 2015-03-01 | Discharge: 2015-03-01 | Disposition: A | Discharge status: Home / Self Care | Payer: No Typology Code available for payment source | Attending: Emergency Medicine | Admitting: Emergency Medicine

## 2015-03-01 DIAGNOSIS — Z8679 Personal history of other diseases of the circulatory system: Secondary | ICD-10-CM | POA: Diagnosis not present

## 2015-03-01 DIAGNOSIS — Z8709 Personal history of other diseases of the respiratory system: Secondary | ICD-10-CM | POA: Diagnosis not present

## 2015-03-01 DIAGNOSIS — Y998 Other external cause status: Secondary | ICD-10-CM | POA: Diagnosis not present

## 2015-03-01 DIAGNOSIS — S61412A Laceration without foreign body of left hand, initial encounter: Secondary | ICD-10-CM

## 2015-03-01 DIAGNOSIS — Z85820 Personal history of malignant melanoma of skin: Secondary | ICD-10-CM | POA: Diagnosis not present

## 2015-03-01 DIAGNOSIS — S81802A Unspecified open wound, left lower leg, initial encounter: Secondary | ICD-10-CM | POA: Insufficient documentation

## 2015-03-01 DIAGNOSIS — S81812A Laceration without foreign body, left lower leg, initial encounter: Secondary | ICD-10-CM

## 2015-03-01 DIAGNOSIS — Z79899 Other long term (current) drug therapy: Secondary | ICD-10-CM | POA: Insufficient documentation

## 2015-03-01 DIAGNOSIS — S61402A Unspecified open wound of left hand, initial encounter: Secondary | ICD-10-CM | POA: Diagnosis not present

## 2015-03-01 DIAGNOSIS — Y9389 Activity, other specified: Secondary | ICD-10-CM | POA: Insufficient documentation

## 2015-03-01 DIAGNOSIS — Y92410 Unspecified street and highway as the place of occurrence of the external cause: Secondary | ICD-10-CM | POA: Insufficient documentation

## 2015-03-01 DIAGNOSIS — E039 Hypothyroidism, unspecified: Secondary | ICD-10-CM | POA: Insufficient documentation

## 2015-03-01 MED ORDER — BACITRACIN ZINC 500 UNIT/GM EX OINT: TOPICAL_OINTMENT | Freq: Once | CUTANEOUS | Status: AC

## 2015-03-01 MED ORDER — BACITRACIN ZINC 500 UNIT/GM EX OINT
TOPICAL_OINTMENT | CUTANEOUS | Status: AC
  Administered 2015-03-01: 1 via TOPICAL
  Filled 2015-03-01: qty 0.9
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
## 2015-03-12 ENCOUNTER — Ambulatory Visit (INDEPENDENT_AMBULATORY_CARE_PROVIDER_SITE_OTHER): Payer: Medicare Other | Admitting: Internal Medicine

## 2015-03-12 VITALS — BP 118/60 | HR 89 | Temp 97.9°F | Resp 16 | Ht 64.0 in | Wt 127.8 lb

## 2015-03-12 DIAGNOSIS — S81802D Unspecified open wound, left lower leg, subsequent encounter: Secondary | ICD-10-CM

## 2015-03-12 NOTE — Progress Notes (Signed)
Pre visit review using our clinic review tool, if applicable. No additional management support is needed unless otherwise documented below in the visit note. 

## 2015-03-12 NOTE — Patient Instructions (Signed)

## 2015-03-13 ENCOUNTER — Encounter: Payer: Self-pay | Admitting: Internal Medicine

## 2015-03-13 DIAGNOSIS — S81802A Unspecified open wound, left lower leg, initial encounter: Secondary | ICD-10-CM | POA: Insufficient documentation
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
## 2017-06-30 DIAGNOSIS — E031 Congenital hypothyroidism without goiter: Secondary | ICD-10-CM | POA: Diagnosis not present

## 2017-06-30 DIAGNOSIS — F0390 Unspecified dementia without behavioral disturbance: Secondary | ICD-10-CM | POA: Diagnosis not present

## 2017-07-18 DIAGNOSIS — F0390 Unspecified dementia without behavioral disturbance: Secondary | ICD-10-CM | POA: Diagnosis not present

## 2017-07-18 DIAGNOSIS — R296 Repeated falls: Secondary | ICD-10-CM | POA: Diagnosis not present

## 2017-07-18 DIAGNOSIS — M6281 Muscle weakness (generalized): Secondary | ICD-10-CM | POA: Diagnosis not present

## 2017-07-18 DIAGNOSIS — N3946 Mixed incontinence: Secondary | ICD-10-CM | POA: Diagnosis not present

## 2017-07-19 DIAGNOSIS — R296 Repeated falls: Secondary | ICD-10-CM | POA: Diagnosis not present

## 2017-07-19 DIAGNOSIS — F0390 Unspecified dementia without behavioral disturbance: Secondary | ICD-10-CM | POA: Diagnosis not present

## 2017-07-19 DIAGNOSIS — N3946 Mixed incontinence: Secondary | ICD-10-CM | POA: Diagnosis not present

## 2017-07-19 DIAGNOSIS — M6281 Muscle weakness (generalized): Secondary | ICD-10-CM | POA: Diagnosis not present

## 2017-07-20 DIAGNOSIS — N3946 Mixed incontinence: Secondary | ICD-10-CM | POA: Diagnosis not present

## 2017-07-20 DIAGNOSIS — F0390 Unspecified dementia without behavioral disturbance: Secondary | ICD-10-CM | POA: Diagnosis not present

## 2017-07-20 DIAGNOSIS — R296 Repeated falls: Secondary | ICD-10-CM | POA: Diagnosis not present

## 2017-07-20 DIAGNOSIS — M6281 Muscle weakness (generalized): Secondary | ICD-10-CM | POA: Diagnosis not present

## 2017-07-21 DIAGNOSIS — M6281 Muscle weakness (generalized): Secondary | ICD-10-CM | POA: Diagnosis not present

## 2017-07-21 DIAGNOSIS — R296 Repeated falls: Secondary | ICD-10-CM | POA: Diagnosis not present

## 2017-07-21 DIAGNOSIS — N3946 Mixed incontinence: Secondary | ICD-10-CM | POA: Diagnosis not present

## 2017-07-21 DIAGNOSIS — F0390 Unspecified dementia without behavioral disturbance: Secondary | ICD-10-CM | POA: Diagnosis not present

## 2017-07-22 DIAGNOSIS — M6281 Muscle weakness (generalized): Secondary | ICD-10-CM | POA: Diagnosis not present

## 2017-07-22 DIAGNOSIS — R296 Repeated falls: Secondary | ICD-10-CM | POA: Diagnosis not present

## 2017-07-22 DIAGNOSIS — F0390 Unspecified dementia without behavioral disturbance: Secondary | ICD-10-CM | POA: Diagnosis not present

## 2017-07-22 DIAGNOSIS — N3946 Mixed incontinence: Secondary | ICD-10-CM | POA: Diagnosis not present

## 2017-07-25 DIAGNOSIS — F0390 Unspecified dementia without behavioral disturbance: Secondary | ICD-10-CM | POA: Diagnosis not present

## 2017-07-25 DIAGNOSIS — R296 Repeated falls: Secondary | ICD-10-CM | POA: Diagnosis not present

## 2017-07-25 DIAGNOSIS — N3946 Mixed incontinence: Secondary | ICD-10-CM | POA: Diagnosis not present

## 2017-07-25 DIAGNOSIS — M6281 Muscle weakness (generalized): Secondary | ICD-10-CM | POA: Diagnosis not present

## 2017-07-26 DIAGNOSIS — M6281 Muscle weakness (generalized): Secondary | ICD-10-CM | POA: Diagnosis not present

## 2017-07-26 DIAGNOSIS — R296 Repeated falls: Secondary | ICD-10-CM | POA: Diagnosis not present

## 2017-07-26 DIAGNOSIS — N3946 Mixed incontinence: Secondary | ICD-10-CM | POA: Diagnosis not present

## 2017-07-26 DIAGNOSIS — F0390 Unspecified dementia without behavioral disturbance: Secondary | ICD-10-CM | POA: Diagnosis not present

## 2017-07-27 DIAGNOSIS — F0390 Unspecified dementia without behavioral disturbance: Secondary | ICD-10-CM | POA: Diagnosis not present

## 2017-07-27 DIAGNOSIS — R296 Repeated falls: Secondary | ICD-10-CM | POA: Diagnosis not present

## 2017-07-27 DIAGNOSIS — N3946 Mixed incontinence: Secondary | ICD-10-CM | POA: Diagnosis not present

## 2017-07-27 DIAGNOSIS — M6281 Muscle weakness (generalized): Secondary | ICD-10-CM | POA: Diagnosis not present

## 2017-07-28 DIAGNOSIS — F0390 Unspecified dementia without behavioral disturbance: Secondary | ICD-10-CM | POA: Diagnosis not present

## 2017-07-28 DIAGNOSIS — M6281 Muscle weakness (generalized): Secondary | ICD-10-CM | POA: Diagnosis not present

## 2017-07-28 DIAGNOSIS — N3946 Mixed incontinence: Secondary | ICD-10-CM | POA: Diagnosis not present

## 2017-07-28 DIAGNOSIS — R296 Repeated falls: Secondary | ICD-10-CM | POA: Diagnosis not present

## 2017-07-29 DIAGNOSIS — N3946 Mixed incontinence: Secondary | ICD-10-CM | POA: Diagnosis not present

## 2017-07-29 DIAGNOSIS — M6281 Muscle weakness (generalized): Secondary | ICD-10-CM | POA: Diagnosis not present

## 2017-07-29 DIAGNOSIS — F0390 Unspecified dementia without behavioral disturbance: Secondary | ICD-10-CM | POA: Diagnosis not present

## 2017-07-29 DIAGNOSIS — R278 Other lack of coordination: Secondary | ICD-10-CM | POA: Diagnosis not present

## 2017-07-29 DIAGNOSIS — R296 Repeated falls: Secondary | ICD-10-CM | POA: Diagnosis not present

## 2017-07-29 DIAGNOSIS — R4789 Other speech disturbances: Secondary | ICD-10-CM | POA: Diagnosis not present

## 2017-08-01 DIAGNOSIS — M6281 Muscle weakness (generalized): Secondary | ICD-10-CM | POA: Diagnosis not present

## 2017-08-01 DIAGNOSIS — N3946 Mixed incontinence: Secondary | ICD-10-CM | POA: Diagnosis not present

## 2017-08-01 DIAGNOSIS — F0390 Unspecified dementia without behavioral disturbance: Secondary | ICD-10-CM | POA: Diagnosis not present

## 2017-08-01 DIAGNOSIS — R296 Repeated falls: Secondary | ICD-10-CM | POA: Diagnosis not present

## 2017-08-01 DIAGNOSIS — R278 Other lack of coordination: Secondary | ICD-10-CM | POA: Diagnosis not present

## 2017-08-01 DIAGNOSIS — R4789 Other speech disturbances: Secondary | ICD-10-CM | POA: Diagnosis not present

## 2017-08-02 DIAGNOSIS — R296 Repeated falls: Secondary | ICD-10-CM | POA: Diagnosis not present

## 2017-08-02 DIAGNOSIS — F0390 Unspecified dementia without behavioral disturbance: Secondary | ICD-10-CM | POA: Diagnosis not present

## 2017-08-02 DIAGNOSIS — M6281 Muscle weakness (generalized): Secondary | ICD-10-CM | POA: Diagnosis not present

## 2017-08-02 DIAGNOSIS — R4789 Other speech disturbances: Secondary | ICD-10-CM | POA: Diagnosis not present

## 2017-08-02 DIAGNOSIS — N3946 Mixed incontinence: Secondary | ICD-10-CM | POA: Diagnosis not present

## 2017-08-02 DIAGNOSIS — R278 Other lack of coordination: Secondary | ICD-10-CM | POA: Diagnosis not present

## 2017-08-03 DIAGNOSIS — M6281 Muscle weakness (generalized): Secondary | ICD-10-CM | POA: Diagnosis not present

## 2017-08-03 DIAGNOSIS — N3946 Mixed incontinence: Secondary | ICD-10-CM | POA: Diagnosis not present

## 2017-08-03 DIAGNOSIS — F0390 Unspecified dementia without behavioral disturbance: Secondary | ICD-10-CM | POA: Diagnosis not present

## 2017-08-03 DIAGNOSIS — R278 Other lack of coordination: Secondary | ICD-10-CM | POA: Diagnosis not present

## 2017-08-03 DIAGNOSIS — R296 Repeated falls: Secondary | ICD-10-CM | POA: Diagnosis not present

## 2017-08-03 DIAGNOSIS — R4789 Other speech disturbances: Secondary | ICD-10-CM | POA: Diagnosis not present

## 2017-08-04 DIAGNOSIS — M6281 Muscle weakness (generalized): Secondary | ICD-10-CM | POA: Diagnosis not present

## 2017-08-04 DIAGNOSIS — F0390 Unspecified dementia without behavioral disturbance: Secondary | ICD-10-CM | POA: Diagnosis not present

## 2017-08-04 DIAGNOSIS — R296 Repeated falls: Secondary | ICD-10-CM | POA: Diagnosis not present

## 2017-08-04 DIAGNOSIS — N3946 Mixed incontinence: Secondary | ICD-10-CM | POA: Diagnosis not present

## 2017-08-04 DIAGNOSIS — R278 Other lack of coordination: Secondary | ICD-10-CM | POA: Diagnosis not present

## 2017-08-04 DIAGNOSIS — R4789 Other speech disturbances: Secondary | ICD-10-CM | POA: Diagnosis not present

## 2017-08-05 DIAGNOSIS — M6281 Muscle weakness (generalized): Secondary | ICD-10-CM | POA: Diagnosis not present

## 2017-08-05 DIAGNOSIS — R4789 Other speech disturbances: Secondary | ICD-10-CM | POA: Diagnosis not present

## 2017-08-05 DIAGNOSIS — N3946 Mixed incontinence: Secondary | ICD-10-CM | POA: Diagnosis not present

## 2017-08-05 DIAGNOSIS — F0390 Unspecified dementia without behavioral disturbance: Secondary | ICD-10-CM | POA: Diagnosis not present

## 2017-08-05 DIAGNOSIS — R296 Repeated falls: Secondary | ICD-10-CM | POA: Diagnosis not present

## 2017-08-05 DIAGNOSIS — R278 Other lack of coordination: Secondary | ICD-10-CM | POA: Diagnosis not present

## 2017-08-08 DIAGNOSIS — R4789 Other speech disturbances: Secondary | ICD-10-CM | POA: Diagnosis not present

## 2017-08-08 DIAGNOSIS — N3946 Mixed incontinence: Secondary | ICD-10-CM | POA: Diagnosis not present

## 2017-08-08 DIAGNOSIS — M6281 Muscle weakness (generalized): Secondary | ICD-10-CM | POA: Diagnosis not present

## 2017-08-08 DIAGNOSIS — F0390 Unspecified dementia without behavioral disturbance: Secondary | ICD-10-CM | POA: Diagnosis not present

## 2017-08-08 DIAGNOSIS — R296 Repeated falls: Secondary | ICD-10-CM | POA: Diagnosis not present

## 2017-08-08 DIAGNOSIS — R278 Other lack of coordination: Secondary | ICD-10-CM | POA: Diagnosis not present

## 2017-08-09 DIAGNOSIS — R278 Other lack of coordination: Secondary | ICD-10-CM | POA: Diagnosis not present

## 2017-08-09 DIAGNOSIS — R296 Repeated falls: Secondary | ICD-10-CM | POA: Diagnosis not present

## 2017-08-09 DIAGNOSIS — F0390 Unspecified dementia without behavioral disturbance: Secondary | ICD-10-CM | POA: Diagnosis not present

## 2017-08-09 DIAGNOSIS — R4789 Other speech disturbances: Secondary | ICD-10-CM | POA: Diagnosis not present

## 2017-08-09 DIAGNOSIS — N3946 Mixed incontinence: Secondary | ICD-10-CM | POA: Diagnosis not present

## 2017-08-09 DIAGNOSIS — M6281 Muscle weakness (generalized): Secondary | ICD-10-CM | POA: Diagnosis not present

## 2017-08-10 DIAGNOSIS — N3946 Mixed incontinence: Secondary | ICD-10-CM | POA: Diagnosis not present

## 2017-08-10 DIAGNOSIS — R296 Repeated falls: Secondary | ICD-10-CM | POA: Diagnosis not present

## 2017-08-10 DIAGNOSIS — R4789 Other speech disturbances: Secondary | ICD-10-CM | POA: Diagnosis not present

## 2017-08-10 DIAGNOSIS — M6281 Muscle weakness (generalized): Secondary | ICD-10-CM | POA: Diagnosis not present

## 2017-08-10 DIAGNOSIS — F0390 Unspecified dementia without behavioral disturbance: Secondary | ICD-10-CM | POA: Diagnosis not present

## 2017-08-10 DIAGNOSIS — R278 Other lack of coordination: Secondary | ICD-10-CM | POA: Diagnosis not present

## 2017-08-11 DIAGNOSIS — R296 Repeated falls: Secondary | ICD-10-CM | POA: Diagnosis not present

## 2017-08-11 DIAGNOSIS — R278 Other lack of coordination: Secondary | ICD-10-CM | POA: Diagnosis not present

## 2017-08-11 DIAGNOSIS — M6281 Muscle weakness (generalized): Secondary | ICD-10-CM | POA: Diagnosis not present

## 2017-08-11 DIAGNOSIS — N3946 Mixed incontinence: Secondary | ICD-10-CM | POA: Diagnosis not present

## 2017-08-11 DIAGNOSIS — F0390 Unspecified dementia without behavioral disturbance: Secondary | ICD-10-CM | POA: Diagnosis not present

## 2017-08-11 DIAGNOSIS — R4789 Other speech disturbances: Secondary | ICD-10-CM | POA: Diagnosis not present

## 2017-08-12 DIAGNOSIS — R4789 Other speech disturbances: Secondary | ICD-10-CM | POA: Diagnosis not present

## 2017-08-12 DIAGNOSIS — M6281 Muscle weakness (generalized): Secondary | ICD-10-CM | POA: Diagnosis not present

## 2017-08-12 DIAGNOSIS — N3946 Mixed incontinence: Secondary | ICD-10-CM | POA: Diagnosis not present

## 2017-08-12 DIAGNOSIS — R296 Repeated falls: Secondary | ICD-10-CM | POA: Diagnosis not present

## 2017-08-12 DIAGNOSIS — F0390 Unspecified dementia without behavioral disturbance: Secondary | ICD-10-CM | POA: Diagnosis not present

## 2017-08-12 DIAGNOSIS — R278 Other lack of coordination: Secondary | ICD-10-CM | POA: Diagnosis not present

## 2017-08-15 DIAGNOSIS — R296 Repeated falls: Secondary | ICD-10-CM | POA: Diagnosis not present

## 2017-08-15 DIAGNOSIS — N3946 Mixed incontinence: Secondary | ICD-10-CM | POA: Diagnosis not present

## 2017-08-15 DIAGNOSIS — F0390 Unspecified dementia without behavioral disturbance: Secondary | ICD-10-CM | POA: Diagnosis not present

## 2017-08-15 DIAGNOSIS — R4789 Other speech disturbances: Secondary | ICD-10-CM | POA: Diagnosis not present

## 2017-08-15 DIAGNOSIS — M6281 Muscle weakness (generalized): Secondary | ICD-10-CM | POA: Diagnosis not present

## 2017-08-15 DIAGNOSIS — R278 Other lack of coordination: Secondary | ICD-10-CM | POA: Diagnosis not present

## 2017-08-16 DIAGNOSIS — N3946 Mixed incontinence: Secondary | ICD-10-CM | POA: Diagnosis not present

## 2017-08-16 DIAGNOSIS — M6281 Muscle weakness (generalized): Secondary | ICD-10-CM | POA: Diagnosis not present

## 2017-08-16 DIAGNOSIS — R278 Other lack of coordination: Secondary | ICD-10-CM | POA: Diagnosis not present

## 2017-08-16 DIAGNOSIS — F0390 Unspecified dementia without behavioral disturbance: Secondary | ICD-10-CM | POA: Diagnosis not present

## 2017-08-16 DIAGNOSIS — R296 Repeated falls: Secondary | ICD-10-CM | POA: Diagnosis not present

## 2017-08-16 DIAGNOSIS — R4789 Other speech disturbances: Secondary | ICD-10-CM | POA: Diagnosis not present

## 2017-08-19 DIAGNOSIS — R4789 Other speech disturbances: Secondary | ICD-10-CM | POA: Diagnosis not present

## 2017-08-19 DIAGNOSIS — M6281 Muscle weakness (generalized): Secondary | ICD-10-CM | POA: Diagnosis not present

## 2017-08-19 DIAGNOSIS — R296 Repeated falls: Secondary | ICD-10-CM | POA: Diagnosis not present

## 2017-08-19 DIAGNOSIS — R278 Other lack of coordination: Secondary | ICD-10-CM | POA: Diagnosis not present

## 2017-08-19 DIAGNOSIS — N3946 Mixed incontinence: Secondary | ICD-10-CM | POA: Diagnosis not present

## 2017-08-19 DIAGNOSIS — F0390 Unspecified dementia without behavioral disturbance: Secondary | ICD-10-CM | POA: Diagnosis not present

## 2017-08-22 DIAGNOSIS — N3946 Mixed incontinence: Secondary | ICD-10-CM | POA: Diagnosis not present

## 2017-08-22 DIAGNOSIS — R296 Repeated falls: Secondary | ICD-10-CM | POA: Diagnosis not present

## 2017-08-22 DIAGNOSIS — F0390 Unspecified dementia without behavioral disturbance: Secondary | ICD-10-CM | POA: Diagnosis not present

## 2017-08-22 DIAGNOSIS — R4789 Other speech disturbances: Secondary | ICD-10-CM | POA: Diagnosis not present

## 2017-08-22 DIAGNOSIS — R278 Other lack of coordination: Secondary | ICD-10-CM | POA: Diagnosis not present

## 2017-08-22 DIAGNOSIS — M6281 Muscle weakness (generalized): Secondary | ICD-10-CM | POA: Diagnosis not present

## 2017-09-07 DIAGNOSIS — M545 Low back pain: Secondary | ICD-10-CM | POA: Diagnosis not present

## 2017-09-07 DIAGNOSIS — M533 Sacrococcygeal disorders, not elsewhere classified: Secondary | ICD-10-CM | POA: Diagnosis not present

## 2017-10-25 DIAGNOSIS — S20212A Contusion of left front wall of thorax, initial encounter: Secondary | ICD-10-CM | POA: Diagnosis not present

## 2017-10-25 DIAGNOSIS — F0390 Unspecified dementia without behavioral disturbance: Secondary | ICD-10-CM | POA: Diagnosis not present

## 2017-10-25 DIAGNOSIS — M6281 Muscle weakness (generalized): Secondary | ICD-10-CM | POA: Diagnosis not present

## 2017-10-25 DIAGNOSIS — R296 Repeated falls: Secondary | ICD-10-CM | POA: Diagnosis not present

## 2017-10-25 DIAGNOSIS — E039 Hypothyroidism, unspecified: Secondary | ICD-10-CM | POA: Diagnosis not present

## 2017-12-30 ENCOUNTER — Emergency Department (HOSPITAL_COMMUNITY): Payer: Medicare Other

## 2017-12-30 ENCOUNTER — Other Ambulatory Visit: Payer: Self-pay

## 2017-12-30 ENCOUNTER — Encounter (HOSPITAL_COMMUNITY): Payer: Self-pay | Admitting: *Deleted

## 2017-12-30 ENCOUNTER — Inpatient Hospital Stay (HOSPITAL_COMMUNITY)
Admission: EM | Admit: 2017-12-30 | Discharge: 2018-01-03 | DRG: 482 | Disposition: A | Discharge status: Skilled Nursing Facility | Payer: Medicare Other | Attending: Internal Medicine | Admitting: Internal Medicine

## 2017-12-30 DIAGNOSIS — Z9114 Patient's other noncompliance with medication regimen: Secondary | ICD-10-CM

## 2017-12-30 DIAGNOSIS — W07XXXA Fall from chair, initial encounter: Secondary | ICD-10-CM | POA: Diagnosis present

## 2017-12-30 DIAGNOSIS — S299XXA Unspecified injury of thorax, initial encounter: Secondary | ICD-10-CM | POA: Diagnosis not present

## 2017-12-30 DIAGNOSIS — Z79890 Hormone replacement therapy (postmenopausal): Secondary | ICD-10-CM

## 2017-12-30 DIAGNOSIS — D72829 Elevated white blood cell count, unspecified: Secondary | ICD-10-CM | POA: Diagnosis present

## 2017-12-30 DIAGNOSIS — S62502A Fracture of unspecified phalanx of left thumb, initial encounter for closed fracture: Secondary | ICD-10-CM | POA: Diagnosis not present

## 2017-12-30 DIAGNOSIS — S72012A Unspecified intracapsular fracture of left femur, initial encounter for closed fracture: Secondary | ICD-10-CM | POA: Diagnosis not present

## 2017-12-30 DIAGNOSIS — Y92129 Unspecified place in nursing home as the place of occurrence of the external cause: Secondary | ICD-10-CM

## 2017-12-30 DIAGNOSIS — Z85828 Personal history of other malignant neoplasm of skin: Secondary | ICD-10-CM

## 2017-12-30 DIAGNOSIS — S0990XA Unspecified injury of head, initial encounter: Secondary | ICD-10-CM | POA: Diagnosis not present

## 2017-12-30 DIAGNOSIS — F0390 Unspecified dementia without behavioral disturbance: Secondary | ICD-10-CM | POA: Diagnosis not present

## 2017-12-30 DIAGNOSIS — E039 Hypothyroidism, unspecified: Secondary | ICD-10-CM | POA: Diagnosis present

## 2017-12-30 DIAGNOSIS — S2241XA Multiple fractures of ribs, right side, initial encounter for closed fracture: Secondary | ICD-10-CM | POA: Diagnosis not present

## 2017-12-30 DIAGNOSIS — S3991XA Unspecified injury of abdomen, initial encounter: Secondary | ICD-10-CM | POA: Diagnosis not present

## 2017-12-30 DIAGNOSIS — M25552 Pain in left hip: Secondary | ICD-10-CM | POA: Diagnosis not present

## 2017-12-30 DIAGNOSIS — F03A0 Unspecified dementia, mild, without behavioral disturbance, psychotic disturbance, mood disturbance, and anxiety: Secondary | ICD-10-CM | POA: Diagnosis present

## 2017-12-30 DIAGNOSIS — Z419 Encounter for procedure for purposes other than remedying health state, unspecified: Secondary | ICD-10-CM

## 2017-12-30 DIAGNOSIS — S62661A Nondisplaced fracture of distal phalanx of left index finger, initial encounter for closed fracture: Secondary | ICD-10-CM | POA: Diagnosis not present

## 2017-12-30 DIAGNOSIS — I959 Hypotension, unspecified: Secondary | ICD-10-CM | POA: Diagnosis not present

## 2017-12-30 DIAGNOSIS — R296 Repeated falls: Secondary | ICD-10-CM | POA: Diagnosis not present

## 2017-12-30 DIAGNOSIS — Z886 Allergy status to analgesic agent status: Secondary | ICD-10-CM

## 2017-12-30 DIAGNOSIS — Z23 Encounter for immunization: Secondary | ICD-10-CM

## 2017-12-30 DIAGNOSIS — S199XXA Unspecified injury of neck, initial encounter: Secondary | ICD-10-CM | POA: Diagnosis not present

## 2017-12-30 DIAGNOSIS — W19XXXA Unspecified fall, initial encounter: Secondary | ICD-10-CM | POA: Diagnosis not present

## 2017-12-30 DIAGNOSIS — S72009A Fracture of unspecified part of neck of unspecified femur, initial encounter for closed fracture: Secondary | ICD-10-CM | POA: Diagnosis present

## 2017-12-30 DIAGNOSIS — I10 Essential (primary) hypertension: Secondary | ICD-10-CM | POA: Diagnosis present

## 2017-12-30 DIAGNOSIS — Z66 Do not resuscitate: Secondary | ICD-10-CM | POA: Diagnosis present

## 2017-12-30 DIAGNOSIS — S72002A Fracture of unspecified part of neck of left femur, initial encounter for closed fracture: Secondary | ICD-10-CM | POA: Diagnosis not present

## 2017-12-30 DIAGNOSIS — Z7983 Long term (current) use of bisphosphonates: Secondary | ICD-10-CM

## 2017-12-30 DIAGNOSIS — Z79899 Other long term (current) drug therapy: Secondary | ICD-10-CM

## 2017-12-30 DIAGNOSIS — S72042A Displaced fracture of base of neck of left femur, initial encounter for closed fracture: Secondary | ICD-10-CM | POA: Diagnosis not present

## 2017-12-30 LAB — BASIC METABOLIC PANEL
ANION GAP: 8 (ref 5–15)
BUN: 12 mg/dL (ref 8–23)
CHLORIDE: 105 mmol/L (ref 98–111)
CO2: 25 mmol/L (ref 22–32)
CREATININE: 0.59 mg/dL (ref 0.44–1.00)
Calcium: 8.8 mg/dL — ABNORMAL LOW (ref 8.9–10.3)
GFR calc Af Amer: >60 mL/min (ref >60)
GFR calc non Af Amer: >60 mL/min (ref >60)
Glucose, Bld: 127 mg/dL — ABNORMAL HIGH (ref 70–99)
POTASSIUM: 3.7 mmol/L (ref 3.5–5.1)
Sodium: 138 mmol/L (ref 135–145)

## 2017-12-30 LAB — CBC WITH DIFFERENTIAL/PLATELET
Basophils Absolute: 0 10*3/uL (ref 0.0–0.1)
Basophils Relative: 0 %
Eosinophils Absolute: 0 10*3/uL (ref 0.0–0.7)
Eosinophils Relative: 0 %
HEMATOCRIT: 40 % (ref 36.0–46.0)
HEMOGLOBIN: 13.1 g/dL (ref 12.0–15.0)
LYMPHS ABS: 0.9 10*3/uL (ref 0.7–4.0)
LYMPHS PCT: 8 %
MCH: 30.1 pg (ref 26.0–34.0)
MCHC: 32.8 g/dL (ref 30.0–36.0)
MCV: 92 fL (ref 78.0–100.0)
MONOS PCT: 5 %
Monocytes Absolute: 0.6 10*3/uL (ref 0.1–1.0)
NEUTROS ABS: 10.5 10*3/uL — AB (ref 1.7–7.7)
NEUTROS PCT: 87 %
Platelets: 310 10*3/uL (ref 150–400)
RBC: 4.35 MIL/uL (ref 3.87–5.11)
RDW: 14.6 % (ref 11.5–15.5)
WBC: 12 10*3/uL — ABNORMAL HIGH (ref 4.0–10.5)

## 2017-12-30 MED ORDER — IOPAMIDOL (ISOVUE-300) INJECTION 61%
100.0000 mL | Freq: Once | INTRAVENOUS | Status: AC | PRN
  Administered 2017-12-30: 100 mL via INTRAVENOUS

## 2017-12-30 MED ORDER — TETANUS-DIPHTH-ACELL PERTUSSIS 5-2.5-18.5 LF-MCG/0.5 IM SUSP
0.5000 mL | Freq: Once | INTRAMUSCULAR | Status: AC
  Administered 2017-12-30: 0.5 mL via INTRAMUSCULAR
  Filled 2017-12-30: qty 0.5

## 2017-12-30 NOTE — ED Triage Notes (Signed)
Pt BIB EMS and coming from a [facility].  Pt slipped out of her chair and landed on the floor.  Pt reports pain in her left hip.  EMS did not note any shortening or rotation.  Pt didn't hit her head or had LOC.  Hx of dementia but a/o x 4.  Staff states pt is non compliant with meds. Bruise on pt's left hand is not from today's fall.   EMS obtained a BP of 190/P

## 2017-12-31 ENCOUNTER — Inpatient Hospital Stay (HOSPITAL_COMMUNITY): Payer: Medicare Other | Admitting: Anesthesiology

## 2017-12-31 ENCOUNTER — Inpatient Hospital Stay (HOSPITAL_COMMUNITY): Payer: Medicare Other

## 2017-12-31 ENCOUNTER — Emergency Department (HOSPITAL_COMMUNITY): Payer: Medicare Other

## 2017-12-31 ENCOUNTER — Encounter (HOSPITAL_COMMUNITY)
Admission: EM | Disposition: A | Discharge status: Skilled Nursing Facility | Payer: Self-pay | Source: Home / Self Care | Attending: Internal Medicine

## 2017-12-31 ENCOUNTER — Encounter (HOSPITAL_COMMUNITY): Payer: Self-pay | Admitting: Internal Medicine

## 2017-12-31 DIAGNOSIS — S3991XA Unspecified injury of abdomen, initial encounter: Secondary | ICD-10-CM | POA: Diagnosis not present

## 2017-12-31 DIAGNOSIS — F0390 Unspecified dementia without behavioral disturbance: Secondary | ICD-10-CM

## 2017-12-31 DIAGNOSIS — R279 Unspecified lack of coordination: Secondary | ICD-10-CM | POA: Diagnosis not present

## 2017-12-31 DIAGNOSIS — R41841 Cognitive communication deficit: Secondary | ICD-10-CM | POA: Diagnosis not present

## 2017-12-31 DIAGNOSIS — I10 Essential (primary) hypertension: Secondary | ICD-10-CM | POA: Diagnosis present

## 2017-12-31 DIAGNOSIS — M25552 Pain in left hip: Secondary | ICD-10-CM | POA: Diagnosis not present

## 2017-12-31 DIAGNOSIS — D72829 Elevated white blood cell count, unspecified: Secondary | ICD-10-CM | POA: Diagnosis present

## 2017-12-31 DIAGNOSIS — E039 Hypothyroidism, unspecified: Secondary | ICD-10-CM | POA: Diagnosis present

## 2017-12-31 DIAGNOSIS — Z79890 Hormone replacement therapy (postmenopausal): Secondary | ICD-10-CM | POA: Diagnosis not present

## 2017-12-31 DIAGNOSIS — S72002A Fracture of unspecified part of neck of left femur, initial encounter for closed fracture: Secondary | ICD-10-CM | POA: Diagnosis not present

## 2017-12-31 DIAGNOSIS — S62502A Fracture of unspecified phalanx of left thumb, initial encounter for closed fracture: Secondary | ICD-10-CM | POA: Diagnosis present

## 2017-12-31 DIAGNOSIS — K5900 Constipation, unspecified: Secondary | ICD-10-CM | POA: Diagnosis not present

## 2017-12-31 DIAGNOSIS — J309 Allergic rhinitis, unspecified: Secondary | ICD-10-CM | POA: Diagnosis not present

## 2017-12-31 DIAGNOSIS — S72009A Fracture of unspecified part of neck of unspecified femur, initial encounter for closed fracture: Secondary | ICD-10-CM | POA: Diagnosis present

## 2017-12-31 DIAGNOSIS — Z79899 Other long term (current) drug therapy: Secondary | ICD-10-CM | POA: Diagnosis not present

## 2017-12-31 DIAGNOSIS — S62661A Nondisplaced fracture of distal phalanx of left index finger, initial encounter for closed fracture: Secondary | ICD-10-CM | POA: Diagnosis not present

## 2017-12-31 DIAGNOSIS — Y92129 Unspecified place in nursing home as the place of occurrence of the external cause: Secondary | ICD-10-CM | POA: Diagnosis not present

## 2017-12-31 DIAGNOSIS — R296 Repeated falls: Secondary | ICD-10-CM | POA: Diagnosis not present

## 2017-12-31 DIAGNOSIS — Z85828 Personal history of other malignant neoplasm of skin: Secondary | ICD-10-CM | POA: Diagnosis not present

## 2017-12-31 DIAGNOSIS — W07XXXA Fall from chair, initial encounter: Secondary | ICD-10-CM | POA: Diagnosis present

## 2017-12-31 DIAGNOSIS — S299XXA Unspecified injury of thorax, initial encounter: Secondary | ICD-10-CM | POA: Diagnosis not present

## 2017-12-31 DIAGNOSIS — Z23 Encounter for immunization: Secondary | ICD-10-CM | POA: Diagnosis not present

## 2017-12-31 DIAGNOSIS — M6281 Muscle weakness (generalized): Secondary | ICD-10-CM | POA: Diagnosis not present

## 2017-12-31 DIAGNOSIS — Z9114 Patient's other noncompliance with medication regimen: Secondary | ICD-10-CM | POA: Diagnosis not present

## 2017-12-31 DIAGNOSIS — R451 Restlessness and agitation: Secondary | ICD-10-CM | POA: Diagnosis not present

## 2017-12-31 DIAGNOSIS — B372 Candidiasis of skin and nail: Secondary | ICD-10-CM | POA: Diagnosis not present

## 2017-12-31 DIAGNOSIS — Z743 Need for continuous supervision: Secondary | ICD-10-CM | POA: Diagnosis not present

## 2017-12-31 DIAGNOSIS — Z66 Do not resuscitate: Secondary | ICD-10-CM | POA: Diagnosis present

## 2017-12-31 DIAGNOSIS — M810 Age-related osteoporosis without current pathological fracture: Secondary | ICD-10-CM | POA: Diagnosis not present

## 2017-12-31 DIAGNOSIS — Z7983 Long term (current) use of bisphosphonates: Secondary | ICD-10-CM | POA: Diagnosis not present

## 2017-12-31 DIAGNOSIS — S72002D Fracture of unspecified part of neck of left femur, subsequent encounter for closed fracture with routine healing: Secondary | ICD-10-CM | POA: Diagnosis not present

## 2017-12-31 DIAGNOSIS — Z886 Allergy status to analgesic agent status: Secondary | ICD-10-CM | POA: Diagnosis not present

## 2017-12-31 HISTORY — PX: HIP PINNING,CANNULATED: SHX1758

## 2017-12-31 LAB — CBC WITH DIFFERENTIAL/PLATELET
BASOS ABS: 0 10*3/uL (ref 0.0–0.1)
BASOS PCT: 0 %
Eosinophils Absolute: 0 10*3/uL (ref 0.0–0.7)
Eosinophils Relative: 0 %
HEMATOCRIT: 38.6 % (ref 36.0–46.0)
Hemoglobin: 12.9 g/dL (ref 12.0–15.0)
Lymphocytes Relative: 7 %
Lymphs Abs: 0.7 10*3/uL (ref 0.7–4.0)
MCH: 30.4 pg (ref 26.0–34.0)
MCHC: 33.4 g/dL (ref 30.0–36.0)
MCV: 90.8 fL (ref 78.0–100.0)
MONO ABS: 0.4 10*3/uL (ref 0.1–1.0)
Monocytes Relative: 4 %
NEUTROS ABS: 8.7 10*3/uL — AB (ref 1.7–7.7)
NEUTROS PCT: 89 %
Platelets: 286 10*3/uL (ref 150–400)
RBC: 4.25 MIL/uL (ref 3.87–5.11)
RDW: 14.5 % (ref 11.5–15.5)
WBC: 9.8 10*3/uL (ref 4.0–10.5)

## 2017-12-31 LAB — COMPREHENSIVE METABOLIC PANEL
ALBUMIN: 3.5 g/dL (ref 3.5–5.0)
ALT: 13 U/L (ref 0–44)
AST: 18 U/L (ref 15–41)
Alkaline Phosphatase: 75 U/L (ref 38–126)
Anion gap: 8 (ref 5–15)
BILIRUBIN TOTAL: 0.9 mg/dL (ref 0.3–1.2)
BUN: 10 mg/dL (ref 8–23)
CHLORIDE: 105 mmol/L (ref 98–111)
CO2: 24 mmol/L (ref 22–32)
Calcium: 8.7 mg/dL — ABNORMAL LOW (ref 8.9–10.3)
Creatinine, Ser: 0.47 mg/dL (ref 0.44–1.00)
GFR calc Af Amer: >60 mL/min (ref >60)
GFR calc non Af Amer: >60 mL/min (ref >60)
GLUCOSE: 137 mg/dL — AB (ref 70–99)
Potassium: 3.8 mmol/L (ref 3.5–5.1)
Sodium: 137 mmol/L (ref 135–145)
TOTAL PROTEIN: 6.6 g/dL (ref 6.5–8.1)

## 2017-12-31 LAB — TSH: TSH: 4.814 u[IU]/mL — AB (ref 0.350–4.500)

## 2017-12-31 LAB — ABO/RH: ABO/RH(D): A NEG

## 2017-12-31 LAB — TYPE AND SCREEN
ABO/RH(D): A NEG
Antibody Screen: NEGATIVE

## 2017-12-31 LAB — GLUCOSE, CAPILLARY: Glucose-Capillary: 102 mg/dL — ABNORMAL HIGH (ref 70–99)

## 2017-12-31 SURGERY — FIXATION, FEMUR, NECK, PERCUTANEOUS, USING SCREW
Anesthesia: Spinal | Site: Hip | Laterality: Left

## 2017-12-31 MED ORDER — LACTATED RINGERS IV SOLN
INTRAVENOUS | Status: DC
  Administered 2017-12-31: 10:00:00 via INTRAVENOUS

## 2017-12-31 MED ORDER — NYSTATIN 100000 UNIT/GM EX POWD
1.0000 g | Freq: Two times a day (BID) | CUTANEOUS | Status: DC
  Administered 2017-12-31 – 2018-01-03 (×6): 1 g via TOPICAL
  Filled 2017-12-31: qty 15

## 2017-12-31 MED ORDER — FENTANYL CITRATE (PF) 100 MCG/2ML IJ SOLN
INTRAMUSCULAR | Status: AC
  Filled 2017-12-31: qty 2

## 2017-12-31 MED ORDER — METOCLOPRAMIDE HCL 5 MG/ML IJ SOLN: 5.0000 mg | Freq: Three times a day (TID) | INTRAMUSCULAR | Status: DC | PRN

## 2017-12-31 MED ORDER — HYDROCODONE-ACETAMINOPHEN 5-325 MG PO TABS: 1.0000 | ORAL_TABLET | ORAL | Status: DC | PRN

## 2017-12-31 MED ORDER — CEFAZOLIN SODIUM-DEXTROSE 2-4 GM/100ML-% IV SOLN
2.0000 g | Freq: Four times a day (QID) | INTRAVENOUS | Status: AC
  Administered 2017-12-31: 2 g via INTRAVENOUS
  Filled 2017-12-31: qty 100

## 2017-12-31 MED ORDER — CEFAZOLIN SODIUM-DEXTROSE 2-4 GM/100ML-% IV SOLN
INTRAVENOUS | Status: AC
  Filled 2017-12-31: qty 100

## 2017-12-31 MED ORDER — ALBUMIN HUMAN 5 % IV SOLN
12.5000 g | Freq: Once | INTRAVENOUS | Status: AC
  Administered 2017-12-31: 12.5 g via INTRAVENOUS

## 2017-12-31 MED ORDER — PROPOFOL 500 MG/50ML IV EMUL
INTRAVENOUS | Status: DC | PRN
  Administered 2017-12-31: 50 ug/kg/min via INTRAVENOUS

## 2017-12-31 MED ORDER — MORPHINE SULFATE (PF) 2 MG/ML IV SOLN: 0.5000 mg | INTRAVENOUS | Status: DC | PRN

## 2017-12-31 MED ORDER — ACETAMINOPHEN 325 MG PO TABS
325.0000 mg | ORAL_TABLET | Freq: Four times a day (QID) | ORAL | Status: DC | PRN
  Filled 2017-12-31: qty 2

## 2017-12-31 MED ORDER — HYDROMORPHONE HCL 1 MG/ML IJ SOLN: 0.2500 mg | INTRAMUSCULAR | Status: DC | PRN

## 2017-12-31 MED ORDER — CEFAZOLIN SODIUM-DEXTROSE 2-4 GM/100ML-% IV SOLN
2.0000 g | Freq: Once | INTRAVENOUS | Status: AC
  Administered 2017-12-31: 2 g via INTRAVENOUS

## 2017-12-31 MED ORDER — LIDOCAINE 5 % EX PTCH
1.0000 | MEDICATED_PATCH | Freq: Every day | CUTANEOUS | Status: DC
  Administered 2018-01-01 – 2018-01-03 (×3): 1 via TRANSDERMAL
  Filled 2017-12-31 (×4): qty 1

## 2017-12-31 MED ORDER — PROPOFOL 10 MG/ML IV BOLUS
INTRAVENOUS | Status: AC
  Filled 2017-12-31: qty 20

## 2017-12-31 MED ORDER — LIDOCAINE 2% (20 MG/ML) 5 ML SYRINGE
INTRAMUSCULAR | Status: DC | PRN
  Administered 2017-12-31: 60 mg via INTRAVENOUS

## 2017-12-31 MED ORDER — ONDANSETRON HCL 4 MG/2ML IJ SOLN
INTRAMUSCULAR | Status: DC | PRN
  Administered 2017-12-31: 4 mg via INTRAVENOUS

## 2017-12-31 MED ORDER — PROPOFOL 10 MG/ML IV BOLUS
INTRAVENOUS | Status: DC | PRN
  Administered 2017-12-31 (×2): 10 mg via INTRAVENOUS
  Administered 2017-12-31: 20 mg via INTRAVENOUS
  Administered 2017-12-31: 10 mg via INTRAVENOUS

## 2017-12-31 MED ORDER — LIDOCAINE 2% (20 MG/ML) 5 ML SYRINGE
INTRAMUSCULAR | Status: AC
  Filled 2017-12-31: qty 5

## 2017-12-31 MED ORDER — ALBUMIN HUMAN 5 % IV SOLN
INTRAVENOUS | Status: AC
  Filled 2017-12-31: qty 250

## 2017-12-31 MED ORDER — PHENYLEPHRINE 40 MCG/ML (10ML) SYRINGE FOR IV PUSH (FOR BLOOD PRESSURE SUPPORT)
PREFILLED_SYRINGE | INTRAVENOUS | Status: DC | PRN
  Administered 2017-12-31 (×6): 80 ug via INTRAVENOUS

## 2017-12-31 MED ORDER — PHENOL 1.4 % MT LIQD: 1.0000 | OROMUCOSAL | Status: DC | PRN

## 2017-12-31 MED ORDER — ONDANSETRON HCL 4 MG/2ML IJ SOLN: 4.0000 mg | Freq: Four times a day (QID) | INTRAMUSCULAR | Status: DC | PRN

## 2017-12-31 MED ORDER — PHENYLEPHRINE 40 MCG/ML (10ML) SYRINGE FOR IV PUSH (FOR BLOOD PRESSURE SUPPORT)
PREFILLED_SYRINGE | INTRAVENOUS | Status: AC
  Filled 2017-12-31: qty 10

## 2017-12-31 MED ORDER — 0.9 % SODIUM CHLORIDE (POUR BTL) OPTIME
TOPICAL | Status: DC | PRN
  Administered 2017-12-31: 1000 mL

## 2017-12-31 MED ORDER — MORPHINE SULFATE (PF) 2 MG/ML IV SOLN
0.5000 mg | INTRAVENOUS | Status: DC | PRN
  Administered 2017-12-31: 0.5 mg via INTRAVENOUS
  Filled 2017-12-31: qty 1

## 2017-12-31 MED ORDER — BUPIVACAINE HCL (PF) 0.75 % IJ SOLN: INTRAMUSCULAR | Status: DC | PRN

## 2017-12-31 MED ORDER — METOCLOPRAMIDE HCL 5 MG PO TABS: 5.0000 mg | ORAL_TABLET | Freq: Three times a day (TID) | ORAL | Status: DC | PRN

## 2017-12-31 MED ORDER — PROMETHAZINE HCL 25 MG/ML IJ SOLN: 6.2500 mg | INTRAMUSCULAR | Status: DC | PRN

## 2017-12-31 MED ORDER — BUPIVACAINE IN DEXTROSE 0.75-8.25 % IT SOLN
INTRATHECAL | Status: DC | PRN
  Administered 2017-12-31: 12 mg via INTRATHECAL

## 2017-12-31 MED ORDER — HYDROCODONE-ACETAMINOPHEN 7.5-325 MG PO TABS: 1.0000 | ORAL_TABLET | ORAL | Status: DC | PRN

## 2017-12-31 MED ORDER — ONDANSETRON HCL 4 MG PO TABS: 4.0000 mg | ORAL_TABLET | Freq: Four times a day (QID) | ORAL | Status: DC | PRN

## 2017-12-31 MED ORDER — MENTHOL 3 MG MT LOZG: 1.0000 | LOZENGE | OROMUCOSAL | Status: DC | PRN

## 2017-12-31 MED ORDER — DOCUSATE SODIUM 100 MG PO CAPS
100.0000 mg | ORAL_CAPSULE | Freq: Two times a day (BID) | ORAL | Status: DC
  Administered 2017-12-31 – 2018-01-03 (×5): 100 mg via ORAL
  Filled 2017-12-31 (×6): qty 1

## 2017-12-31 SURGICAL SUPPLY — 39 items
BAG ZIPLOCK 12X15 (MISCELLANEOUS) ×3 IMPLANT
BIT DRILL 5 ACE CANN QC (BIT) ×3 IMPLANT
BNDG GAUZE ELAST 4 BULKY (GAUZE/BANDAGES/DRESSINGS) ×3 IMPLANT
COVER SURGICAL LIGHT HANDLE (MISCELLANEOUS) ×3 IMPLANT
DRAPE STERI IOBAN 125X83 (DRAPES) ×3 IMPLANT
DRSG AQUACEL AG ADV 3.5X 4 (GAUZE/BANDAGES/DRESSINGS) ×3 IMPLANT
DRSG PAD ABDOMINAL 8X10 ST (GAUZE/BANDAGES/DRESSINGS) ×3 IMPLANT
DRSG TEGADERM 4X4.75 (GAUZE/BANDAGES/DRESSINGS) IMPLANT
DURAPREP 26ML APPLICATOR (WOUND CARE) ×3 IMPLANT
ELECT REM PT RETURN 15FT ADLT (MISCELLANEOUS) ×3 IMPLANT
FACESHIELD WRAPAROUND (MASK) IMPLANT
GAUZE SPONGE 4X4 12PLY STRL (GAUZE/BANDAGES/DRESSINGS) ×3 IMPLANT
GAUZE XEROFORM 1X8 LF (GAUZE/BANDAGES/DRESSINGS) ×3 IMPLANT
GAUZE XEROFORM 5X9 LF (GAUZE/BANDAGES/DRESSINGS) ×3 IMPLANT
GLOVE BIO SURGEON STRL SZ7.5 (GLOVE) ×3 IMPLANT
GLOVE BIOGEL PI IND STRL 8 (GLOVE) ×1 IMPLANT
GLOVE BIOGEL PI INDICATOR 8 (GLOVE) ×2
GLOVE ECLIPSE 8.0 STRL XLNG CF (GLOVE) ×3 IMPLANT
GOWN STRL REUS W/TWL XL LVL3 (GOWN DISPOSABLE) ×3 IMPLANT
NS IRRIG 1000ML POUR BTL (IV SOLUTION) ×3 IMPLANT
PACK GENERAL/GYN (CUSTOM PROCEDURE TRAY) ×3 IMPLANT
PAD CAST 4YDX4 CTTN HI CHSV (CAST SUPPLIES) ×1 IMPLANT
PADDING CAST COTTON 4X4 STRL (CAST SUPPLIES) ×2
PIN THREADED GUIDE ACE (PIN) ×9 IMPLANT
POSITIONER SURGICAL ARM (MISCELLANEOUS) ×3 IMPLANT
SCR CAN CANC 8.0X90MM 24 NS (Screw) ×3 IMPLANT
SCREW CAN CANC 8.0X90MM 24 NS (Screw) ×1 IMPLANT
SCREW CANN 8.0 80MM (Screw) ×6 IMPLANT
SPONGE LAP 18X18 RF (DISPOSABLE) IMPLANT
STAPLER VISISTAT 35W (STAPLE) ×3 IMPLANT
SUT VIC AB 0 CT1 27 (SUTURE) ×2
SUT VIC AB 0 CT1 27XBRD ANTBC (SUTURE) ×1 IMPLANT
SUT VIC AB 1 CT1 27 (SUTURE) ×4
SUT VIC AB 1 CT1 27XBRD ANTBC (SUTURE) ×2 IMPLANT
SUT VIC AB 2-0 CT1 27 (SUTURE) ×2
SUT VIC AB 2-0 CT1 TAPERPNT 27 (SUTURE) ×1 IMPLANT
TOWEL OR 17X26 10 PK STRL BLUE (TOWEL DISPOSABLE) ×6 IMPLANT
TRAY FOLEY MTR SLVR 16FR STAT (SET/KITS/TRAYS/PACK) IMPLANT
WATER STERILE IRR 1000ML POUR (IV SOLUTION) ×3 IMPLANT

## 2017-12-31 NOTE — ED Provider Notes (Signed)
[facility] DEPT Provider Note   CSN: [id number] Arrival date & time: 12/30/17  1826     History   Chief Complaint Chief Complaint  Patient presents with  . Fall    HPI Patricia Gilbert is a 84 y.o. female.  84 year old female with past medical history including hypothyroidism, dementia who presents with fall.  Patient was brought in by EMS from her nursing facility where she slipped out of her chair and landed on the floor.  She since the fall she has been complaining of pain in her left hip.  No loss of consciousness.  Staff reported that she is noncompliant with her medications.  The history is provided by the patient and the nursing home.  Fall     Past Medical History:  Diagnosis Date  . Allergic rhinitis, cause unspecified   . Hypothyroid   . Melanoma ([place]) 1997  . Sinus tachycardia   . Syncope 08/20/2011   single event, neg echo and cards eval     Patient Active Problem List   Diagnosis Date Noted  . Hip fracture ([place]) 12/31/2017  . Wound of left leg 03/13/2015  . Osteoporosis, senile 01/09/2013  . Allergic rhinitis, cause unspecified   . Hypothyroid   . Mild dementia   . Syncope 08/20/2011  . Sinus tachycardia 08/20/2011    Past Surgical History:  Procedure Laterality Date  . CESAREAN SECTION     [id number]  . MELANOMA EXCISION  1997     OB History   None      Home Medications    Prior to Admission medications   Medication Sig Start Date End Date Taking? Authorizing Provider  lidocaine (LIDODERM) 5 % Place 1 patch onto the skin. To lower back daily in the am and remove at bedtime as needed   Yes [provider]  nystatin (NYSTATIN) powder Apply 1 g topically 2 (two) times daily.   Yes [provider]  alendronate (FOSAMAX) 70 MG tablet Take 1 tablet (70 mg total) by mouth every 7 (seven) days. Take with a full glass of water on an empty stomach. Patient not taking: Reported on 12/30/2017 01/09/13    Rowe Clack, MD  levothyroxine (SYNTHROID, LEVOTHROID) 50 MCG tablet Take 1 tablet (50 mcg total) by mouth daily. Patient not taking: Reported on 12/30/2017 05/09/14   Rowe Clack, MD    Family History Family History  Problem Relation Age of Onset  . Prostate cancer Father   . Diabetes Other   . Stroke Other     Social History Social History   Tobacco Use  . Smoking status: Never Smoker  . Smokeless tobacco: Never Used  Substance Use Topics  . Alcohol use: Yes  . Drug use: No     Allergies   Aspirin   Review of Systems Review of Systems  Unable to perform ROS: Dementia     Physical Exam Updated Vital Signs BP (!) 179/74   Pulse (!) 103   Temp 97.6 F (36.4 C) (Oral)   Resp 17   SpO2 94%   Physical Exam  Constitutional: She appears well-developed.  Frail, chronically ill appearing woman in NAD  HENT:  Head: Normocephalic and atraumatic.  Moist mucous membranes  Eyes: Conjunctivae are normal.  Neck: Neck supple.  Cardiovascular: Normal rate, regular rhythm, normal heart sounds and intact distal pulses.  No murmur heard. Pulmonary/Chest: Effort normal and breath sounds normal.  Abdominal: Soft. Bowel sounds are normal. She exhibits no  distension. There is no tenderness.  Musculoskeletal: She exhibits tenderness. She exhibits no edema.  L thumb w/ distal ecchymosis and tenderness, normal ROM; unable to range L hip 2/2 pain, no obvious L leg deformity  Neurological: She is alert. No sensory deficit.  Oriented to person  Skin: Skin is warm and dry.  Skin tear L arm, L lateral knee, R lower leg near ankle  Psychiatric: Thought content is paranoid and delusional. Cognition and memory are impaired.  Nursing note and vitals reviewed.    ED Treatments / Results  Labs (all labs ordered are listed, but only abnormal results are displayed) Labs Reviewed  BASIC METABOLIC PANEL - Abnormal; Notable for the following components:      Result Value    Glucose, Bld 127 (*)    Calcium 8.8 (*)    All other components within normal limits  CBC WITH DIFFERENTIAL/PLATELET - Abnormal; Notable for the following components:   WBC 12.0 (*)    Neutro Abs 10.5 (*)    All other components within normal limits    EKG None  Radiology Dg Chest 2 View  Result Date: 12/30/2017 CLINICAL DATA:  Fall, LEFT hip pain. EXAM: CHEST - 2 VIEW COMPARISON:  None. FINDINGS: Mild cardiomegaly. No confluent opacity to suggest a developing pneumonia. No pleural effusion or pneumothorax seen. Probable atelectasis or confluent scarring at the LEFT costophrenic angle. Multiple RIGHT-sided rib fractures, of uncertain age, most likely chronic. Multiple compression fracture deformities within the mid and lower thoracic spine, of uncertain chronicity. IMPRESSION: 1. No active cardiopulmonary disease. No evidence of pneumonia or pulmonary edema. 2. Mild cardiomegaly. 3. Multiple RIGHT-sided rib fractures, of uncertain age, favor chronic. 4. Multiple compression fracture deformities within the mid and lower thoracic spine, of uncertain age but also most likely chronic. If any midline back pain, would consider CT for further characterization. Electronically Signed   By: Franki Cabot M.D.   On: 12/30/2017 20:55   Ct Head Wo Contrast  Result Date: 12/30/2017 CLINICAL DATA:  Patient slipped out of chair and fell on the floor. EXAM: CT HEAD WITHOUT CONTRAST CT CERVICAL SPINE WITHOUT CONTRAST TECHNIQUE: Multidetector CT imaging of the head and cervical spine was performed following the standard protocol without intravenous contrast. Multiplanar CT image reconstructions of the cervical spine were also generated. COMPARISON:  None. FINDINGS: CT HEAD FINDINGS Brain: There is no evidence for acute hemorrhage, hydrocephalus, mass lesion, or abnormal extra-axial fluid collection. No definite CT evidence for acute infarction. Diffuse loss of parenchymal volume is consistent with atrophy. Patchy low  attenuation in the deep hemispheric and periventricular white matter is nonspecific, but likely reflects chronic microvascular ischemic demyelination. Vascular: No hyperdense vessel or unexpected calcification. Skull: Normal. Negative for fracture or focal lesion. Sinuses/Orbits: No acute finding. Other: None. CT CERVICAL SPINE FINDINGS Alignment: Normal. Skull base and vertebrae: No acute fracture. No primary bone lesion or focal pathologic process. Soft tissues and spinal canal: No prevertebral fluid or swelling. No visible canal hematoma. Disc levels:  Loss of disc height noted at C2-3 in C5-6. Upper chest: Biapical pleuroparenchymal scarring. Other: None. IMPRESSION: 1. No acute intracranial abnormality. Atrophy with chronic small vessel white matter ischemic disease. 2. No cervical spine fracture. Degenerative changes at C2-3 and C5-6. Electronically Signed   By: Misty Stanley M.D.   On: 12/30/2017 21:11   Ct Chest W Contrast  Result Date: 12/31/2017 CLINICAL DATA:  [place] out of chair, possible rib fracture pain to the left hip EXAM: CT CHEST, ABDOMEN,  AND PELVIS WITH CONTRAST TECHNIQUE: Multidetector CT imaging of the chest, abdomen and pelvis was performed following the standard protocol during bolus administration of intravenous contrast. CONTRAST:  181mL ISOVUE-300 IOPAMIDOL (ISOVUE-300) INJECTION 61% COMPARISON:  Radiograph 12/30/2017 FINDINGS: CT CHEST FINDINGS Cardiovascular: Nonaneurysmal aorta. Moderate aortic atherosclerosis. Normal heart size. Small pericardial effusion. Mediastinum/Nodes: Negative for hematoma. Midline trachea. Subcentimeter nodules in the right lobe. No significant adenopathy. Esophagus within normal limits Lungs/Pleura: Calcified nodule in the right lower lobe. No acute consolidation or effusion. Apical scarring on the right. Atelectasis at the left base. No pneumothorax Musculoskeletal: Sternum is intact. Old right fourth, fifth, sixth, seventh, eighth, ninth and tenth  posterior rib fractures. Age indeterminate moderate compression fractures at T4, T5, T8, T10, T11 and T12. CT ABDOMEN PELVIS FINDINGS Hepatobiliary: No focal liver abnormality is seen. No gallstones, gallbladder wall thickening, or biliary dilatation. Pancreas: Unremarkable. No pancreatic ductal dilatation or surrounding inflammatory changes. Spleen: Calcified granuloma Adrenals/Urinary Tract: Adrenal glands are unremarkable. Kidneys are normal, without renal calculi, focal lesion, or hydronephrosis. Bladder is unremarkable. Stomach/Bowel: Stomach is within normal limits. Appendix appears normal. No evidence of bowel wall thickening, distention, or inflammatory changes. Sigmoid colon diverticular disease without acute inflammation Vascular/Lymphatic: Mild aortic atherosclerosis. No aneurysm. No significantly enlarged lymph nodes Reproductive: Calcified fibroids.  No adnexal mass Other: No free air or free fluid Musculoskeletal: Acute left femoral neck fracture. Age indeterminate mild inferior endplate deformity at L3. Mild to moderate age indeterminate compression fracture at L5. IMPRESSION: 1. No CT evidence for acute mediastinal injury. No CT evidence for acute solid organ injury within the abdomen or pelvis. No free air or free fluid. 2. Multiple age indeterminate moderate compression fractures T4-T5, T8, T10 through T12, and L3 and L5. No retropulsion. 3. Acute left femoral neck fracture 4. Sigmoid colon diverticular disease without acute inflammation Electronically Signed   By: Donavan Foil M.D.   On: 12/31/2017 00:04   Ct Cervical Spine Wo Contrast  Result Date: 12/30/2017 CLINICAL DATA:  Patient slipped out of chair and fell on the floor. EXAM: CT HEAD WITHOUT CONTRAST CT CERVICAL SPINE WITHOUT CONTRAST TECHNIQUE: Multidetector CT imaging of the head and cervical spine was performed following the standard protocol without intravenous contrast. Multiplanar CT image reconstructions of the cervical spine  were also generated. COMPARISON:  None. FINDINGS: CT HEAD FINDINGS Brain: There is no evidence for acute hemorrhage, hydrocephalus, mass lesion, or abnormal extra-axial fluid collection. No definite CT evidence for acute infarction. Diffuse loss of parenchymal volume is consistent with atrophy. Patchy low attenuation in the deep hemispheric and periventricular white matter is nonspecific, but likely reflects chronic microvascular ischemic demyelination. Vascular: No hyperdense vessel or unexpected calcification. Skull: Normal. Negative for fracture or focal lesion. Sinuses/Orbits: No acute finding. Other: None. CT CERVICAL SPINE FINDINGS Alignment: Normal. Skull base and vertebrae: No acute fracture. No primary bone lesion or focal pathologic process. Soft tissues and spinal canal: No prevertebral fluid or swelling. No visible canal hematoma. Disc levels:  Loss of disc height noted at C2-3 in C5-6. Upper chest: Biapical pleuroparenchymal scarring. Other: None. IMPRESSION: 1. No acute intracranial abnormality. Atrophy with chronic small vessel white matter ischemic disease. 2. No cervical spine fracture. Degenerative changes at C2-3 and C5-6. Electronically Signed   By: Misty Stanley M.D.   On: 12/30/2017 21:11   Ct Abdomen Pelvis W Contrast  Result Date: 12/31/2017 CLINICAL DATA:  [place] out of chair, possible rib fracture pain to the left hip EXAM: CT CHEST, ABDOMEN, AND PELVIS WITH  CONTRAST TECHNIQUE: Multidetector CT imaging of the chest, abdomen and pelvis was performed following the standard protocol during bolus administration of intravenous contrast. CONTRAST:  143mL ISOVUE-300 IOPAMIDOL (ISOVUE-300) INJECTION 61% COMPARISON:  Radiograph 12/30/2017 FINDINGS: CT CHEST FINDINGS Cardiovascular: Nonaneurysmal aorta. Moderate aortic atherosclerosis. Normal heart size. Small pericardial effusion. Mediastinum/Nodes: Negative for hematoma. Midline trachea. Subcentimeter nodules in the right lobe. No significant  adenopathy. Esophagus within normal limits Lungs/Pleura: Calcified nodule in the right lower lobe. No acute consolidation or effusion. Apical scarring on the right. Atelectasis at the left base. No pneumothorax Musculoskeletal: Sternum is intact. Old right fourth, fifth, sixth, seventh, eighth, ninth and tenth posterior rib fractures. Age indeterminate moderate compression fractures at T4, T5, T8, T10, T11 and T12. CT ABDOMEN PELVIS FINDINGS Hepatobiliary: No focal liver abnormality is seen. No gallstones, gallbladder wall thickening, or biliary dilatation. Pancreas: Unremarkable. No pancreatic ductal dilatation or surrounding inflammatory changes. Spleen: Calcified granuloma Adrenals/Urinary Tract: Adrenal glands are unremarkable. Kidneys are normal, without renal calculi, focal lesion, or hydronephrosis. Bladder is unremarkable. Stomach/Bowel: Stomach is within normal limits. Appendix appears normal. No evidence of bowel wall thickening, distention, or inflammatory changes. Sigmoid colon diverticular disease without acute inflammation Vascular/Lymphatic: Mild aortic atherosclerosis. No aneurysm. No significantly enlarged lymph nodes Reproductive: Calcified fibroids.  No adnexal mass Other: No free air or free fluid Musculoskeletal: Acute left femoral neck fracture. Age indeterminate mild inferior endplate deformity at L3. Mild to moderate age indeterminate compression fracture at L5. IMPRESSION: 1. No CT evidence for acute mediastinal injury. No CT evidence for acute solid organ injury within the abdomen or pelvis. No free air or free fluid. 2. Multiple age indeterminate moderate compression fractures T4-T5, T8, T10 through T12, and L3 and L5. No retropulsion. 3. Acute left femoral neck fracture 4. Sigmoid colon diverticular disease without acute inflammation Electronically Signed   By: Donavan Foil M.D.   On: 12/31/2017 00:04   Dg Hip Unilat With Pelvis 2-3 Views Left  Result Date: 12/30/2017 CLINICAL DATA:   Fall, LEFT hip pain. EXAM: DG HIP (WITH OR WITHOUT PELVIS) 2-3V LEFT COMPARISON:  None. FINDINGS: There is a slightly displaced fracture of the LEFT femoral neck, subcapital, with suspected impaction at the fracture site. Femoral head remains appropriately positioned relative to the acetabulum. No additional fracture identified within the osseous pelvis or about the RIGHT hip. Osteopenia limits characterization of osseous detail. Soft tissues about the LEFT hip are unremarkable. IMPRESSION: Slightly displaced fracture of the LEFT femoral neck, subcapital region. Electronically Signed   By: Franki Cabot M.D.   On: 12/30/2017 20:51    Procedures Procedures (including critical care time)  Medications Ordered in ED Medications  Tdap (BOOSTRIX) injection 0.5 mL (0.5 mLs Intramuscular Given 12/30/17 2150)  iopamidol (ISOVUE-300) 61 % injection 100 mL (100 mLs Intravenous Contrast Given 12/30/17 2326)     Initial Impression / Assessment and Plan / ED Course  I have reviewed the triage vital signs and the nursing notes.  Pertinent labs & imaging results that were available during my care of the patient were reviewed by me and considered in my medical decision making (see chart for details).     Alert, no acute distress on exam.  Vital signs reassuring.  CT head and C-spine negative acute.  Hip x-ray shows slightly displaced fracture left femoral neck.  Discussed with orthopedics, Dr. Ninfa Linden, who will see the patient in consultation.  Chest x-ray showed possible subacute rib fractures, thoracic fx.  Obtain CT of chest, abdomen, and pelvis to  rule out any acute fractures.  Imaging shows multiple old spine fractures but no acute injuries.  I attempted to contact the patient's son without success. Romeo Rabon [phone],. I did contact nursing facility to update on plan to admit.  Discussed admission with hospitalist, Dr. Hal Hope, and pt admitted for further care. Final Clinical Impressions(s) / ED  Diagnoses   Final diagnoses:  None    ED Discharge Orders    None       Pria Klosinski, Wenda Overland, MD 12/31/17 [phone]

## 2017-12-31 NOTE — Anesthesia Postprocedure Evaluation (Signed)
Anesthesia Post Note  Patient: Patricia Gilbert  Procedure(s) Performed: CANNULATED HIP PINNING (Left Hip)     Patient location during evaluation: PACU Anesthesia Type: Spinal Level of consciousness: sedated Pain management: pain level controlled Vital Signs Assessment: post-procedure vital signs reviewed and stable Respiratory status: spontaneous breathing and respiratory function stable Cardiovascular status: stable Postop Assessment: no apparent nausea or vomiting Anesthetic complications: no    Last Vitals:  Vitals:   12/31/17 1215 12/31/17 1230  BP: (!) 156/67 (!) 172/69  Pulse: 89 84  Resp: 17 17  Temp:    SpO2: 98% 94%    Last Pain:  Vitals:   12/31/17 1230  TempSrc:   PainSc: 0-No pain                 Adalind Weitz DANIEL

## 2017-12-31 NOTE — Progress Notes (Signed)
Patient ID: Patricia Gilbert, female   DOB: Nov 24, 1925, 84 y.o.   MRN: [id number] We will let the patient eat today since I am unable to obtain consent for surgery from her healthcare power of attorney as of yet.  I have left a message with her son Caitlyn Buchanan [phone].  Hopefully once informed consent can be obtained for surgery, we can come up with a plan for proceeding.

## 2017-12-31 NOTE — Op Note (Signed)
NAME: Patricia Gilbert, Patricia Gilbert MEDICAL RECORD [id number] ACCOUNT [id number] DATE OF BIRTH:[birth year] FACILITY: [facility] LOCATION: [facility] PHYSICIAN:CHRISTOPHER Kerry Fort, MD  OPERATIVE REPORT  DATE OF PROCEDURE:  12/31/2017  PREOPERATIVE DIAGNOSIS:  Nondisplaced left hip femoral neck fracture.  POSTOPERATIVE DIAGNOSIS:  Nondisplaced left hip femoral neck fracture.  PROCEDURE:  Cannulated screw fixation of left nondisplaced femoral neck fracture.  IMPLANTS:  Biomet 8.0 cannulated screws x3.  SURGEON:  Lind Guest. Ninfa Linden, MD  ASSISTANT:  Erskine Emery, PA-C  ANESTHESIA:  Spinal.  ANTIBIOTICS:  2 g IV Ancef.  ESTIMATED BLOOD LOSS:  Less than 50 mL.  COMPLICATIONS:  None.  INDICATIONS:  The patient is a very pleasantly demented 84 year old who sustained a mechanical fall yesterday at the nursing facility.  After left hip pain and difficulty with ambulating, she was sent to the [facility] Emergency Room.  X-rays confirmed  a nondisplaced left hip femoral neck fracture.  She is now presenting for definitive fixation of this fracture.  I talked to her son at length.  He is healthcare power of attorney.  He understands fully the risk of acute blood loss anemia as well as the  failure of fracture fixation.  She understands our goals are to decrease pain and improve quality of life.  DESCRIPTION OF PROCEDURE:  After informed consent was obtained and appropriate left hip was marked, she was brought to the operating room.  Spinal anesthesia was obtained while she was on her stretcher.  She was then placed on the fracture table with a  perineal post in place and her left operative leg in in-line skeletal traction with no traction applied.  Her right operative hip was flexed and abducted and placed in a well-leg holder with appropriate padding in the popliteal region.  We then assessed  her left hip under fluoroscopy and verified that it was a nondisplaced fracture.  We then prepped the  left hip with DuraPrep and sterile drapes.  A time-out was called, and she was identified as the correct patient and correct left hip.  We then made an  incision on the lateral hip just at the level of the lesser trochanter.  We then placed 3 temporary guide pins in an inverted triangle format just proximal to the lesser trochanter with all 3 in good position of the bone, traversing the fracture itself.   We then placed our 3 cannulated screws without difficulty and removed the guide pins.  I put her hip through internal and external rotation and brought the C-arm in and out to verify placement of  all 3 screws and stability of the fracture.  We then  irrigated the soft tissue with normal saline solution and closed the deep tissues with 0 Vicryl, followed by 2-0 Vicryl subcutaneous tissue and interrupted staples on the skin.  Xeroform and well-padded sterile dressing were applied.  She was taken off  the Hana table and taken to recovery room in stable condition.  All final counts were correct.  There were no complications noted.  Note Benita Stabile, PA-C, assisted the entire case.  His assistance was crucial for facilitating all aspects of this case.  LN/NUANCE  D:12/31/2017 T:12/31/2017 JOB:[id number]

## 2017-12-31 NOTE — Progress Notes (Signed)
Patient ID: Patricia Gilbert, female   DOB: 06-Sep-1925, 84 y.o.   MRN: [id number] I have now spoken to her son, who is healthcare power of attorney.  He does provide informed consent via the phone (he lives in [place]).  Risks and benefits of surgery were explained in detail.

## 2017-12-31 NOTE — H&P (Signed)
History and Physical    Patricia Gilbert:[id number] DOB: 03/19/1926 DOA: 12/30/2017  PCP: Patient, No Pcp Per  Patient coming from: Group home.  Chief Complaint: Fall.  HPI: Patricia Gilbert is a 84 y.o. female with history of dementia hypothyroidism had a fall at her living facility when she slipped out of the chair and fell.  Not sure if she hit her head or not but did not lose consciousness.  Patient had not complained of any chest pain or shortness of breath.  Patient was complaining of left hip pain.  ED Course: In the ER patient had CT head C-spine chest and abdomen.  Shows acute fracture involving the left hip.  On-call orthopedic surgeon Dr. Ninfa Linden has been consulted.  In addition the CAT scan shows age-indeterminate multiple thoracic spine fractures.  Patient also has fracture of the left thumb.  Multiple bruises on exam.  Review of Systems: As per HPI, rest all negative.   Past Medical History:  Diagnosis Date  . Allergic rhinitis, cause unspecified   . Hypothyroid   . Melanoma ([place]) 1997  . Sinus tachycardia   . Syncope 08/20/2011   single event, neg echo and cards eval     Past Surgical History:  Procedure Laterality Date  . CESAREAN SECTION     [id number]  . MELANOMA EXCISION  1997     reports that she has never smoked. She has never used smokeless tobacco. She reports that she drinks alcohol. She reports that she does not use drugs.  Allergies  Allergen Reactions  . Aspirin     Family History  Problem Relation Age of Onset  . Prostate cancer Father   . Diabetes Other   . Stroke Other     Prior to Admission medications   Medication Sig Start Date End Date Taking? Authorizing Provider  lidocaine (LIDODERM) 5 % Place 1 patch onto the skin. To lower back daily in the am and remove at bedtime as needed   Yes [provider]  nystatin (NYSTATIN) powder Apply 1 g topically 2 (two) times daily.   Yes [provider]  alendronate  (FOSAMAX) 70 MG tablet Take 1 tablet (70 mg total) by mouth every 7 (seven) days. Take with a full glass of water on an empty stomach. Patient not taking: Reported on 12/30/2017 01/09/13   Rowe Clack, MD  levothyroxine (SYNTHROID, LEVOTHROID) 50 MCG tablet Take 1 tablet (50 mcg total) by mouth daily. Patient not taking: Reported on 12/30/2017 05/09/14   Rowe Clack, MD    Physical Exam: Vitals:   12/30/17 2015 12/30/17 2150 12/30/17 2337 12/31/17 0006  BP: (!) 165/54 (!) 155/79 (!) 188/73 (!) 179/74  Pulse: 94 (!) 102 (!) 108 (!) 103  Resp: 17 16 16 17   Temp:      TempSrc:      SpO2: 96% 93% 93% 94%      Constitutional: Moderately built and nourished. Vitals:   12/30/17 2015 12/30/17 2150 12/30/17 2337 12/31/17 0006  BP: (!) 165/54 (!) 155/79 (!) 188/73 (!) 179/74  Pulse: 94 (!) 102 (!) 108 (!) 103  Resp: 17 16 16 17   Temp:      TempSrc:      SpO2: 96% 93% 93% 94%   Eyes: Anicteric no pallor. ENMT: No discharge from the ears eyes nose or mouth. Neck: No mass felt.  No neck rigidity.  No JVD appreciated. Respiratory: No rhonchi or crepitations. Cardiovascular: S1-S2 heard no murmurs appreciated. Abdomen:  Soft nontender bowel sounds present. Musculoskeletal: Pain on moving left hip and left thumb has ecchymotic areas. Skin: Multiple ecchymotic areas. Neurologic: Alert awake oriented to name and place moves all extremities. Psychiatric: Has dementia.   Labs on Admission: I have personally reviewed following labs and imaging studies  CBC: Recent Labs  Lab 12/30/17 2201  WBC 12.0*  NEUTROABS 10.5*  HGB 13.1  HCT 40.0  MCV 92.0  PLT 323   Basic Metabolic Panel: Recent Labs  Lab 12/30/17 2201  NA 138  K 3.7  CL 105  CO2 25  GLUCOSE 127*  BUN 12  CREATININE 0.59  CALCIUM 8.8*   GFR: CrCl cannot be calculated (Unknown ideal weight.). Liver Function Tests: No results for input(s): AST, ALT, ALKPHOS, BILITOT, PROT, ALBUMIN in the last 168  hours. No results for input(s): LIPASE, AMYLASE in the last 168 hours. No results for input(s): AMMONIA in the last 168 hours. Coagulation Profile: No results for input(s): INR, PROTIME in the last 168 hours. Cardiac Enzymes: No results for input(s): CKTOTAL, CKMB, CKMBINDEX, TROPONINI in the last 168 hours. BNP (last 3 results) No results for input(s): PROBNP in the last 8760 hours. HbA1C: No results for input(s): HGBA1C in the last 72 hours. CBG: No results for input(s): GLUCAP in the last 168 hours. Lipid Profile: No results for input(s): CHOL, HDL, LDLCALC, TRIG, CHOLHDL, LDLDIRECT in the last 72 hours. Thyroid Function Tests: No results for input(s): TSH, T4TOTAL, FREET4, T3FREE, THYROIDAB in the last 72 hours. Anemia Panel: No results for input(s): VITAMINB12, FOLATE, FERRITIN, TIBC, IRON, RETICCTPCT in the last 72 hours. Urine analysis: No results found for: COLORURINE, APPEARANCEUR, LABSPEC, PHURINE, GLUCOSEU, HGBUR, BILIRUBINUR, KETONESUR, PROTEINUR, UROBILINOGEN, NITRITE, LEUKOCYTESUR Sepsis Labs: @LABRCNTIP (procalcitonin:4,lacticidven:4) )No results found for this or any previous visit (from the past 240 hour(s)).   Radiological Exams on Admission: Dg Chest 2 View  Result Date: 12/30/2017 CLINICAL DATA:  Fall, LEFT hip pain. EXAM: CHEST - 2 VIEW COMPARISON:  None. FINDINGS: Mild cardiomegaly. No confluent opacity to suggest a developing pneumonia. No pleural effusion or pneumothorax seen. Probable atelectasis or confluent scarring at the LEFT costophrenic angle. Multiple RIGHT-sided rib fractures, of uncertain age, most likely chronic. Multiple compression fracture deformities within the mid and lower thoracic spine, of uncertain chronicity. IMPRESSION: 1. No active cardiopulmonary disease. No evidence of pneumonia or pulmonary edema. 2. Mild cardiomegaly. 3. Multiple RIGHT-sided rib fractures, of uncertain age, favor chronic. 4. Multiple compression fracture deformities within  the mid and lower thoracic spine, of uncertain age but also most likely chronic. If any midline back pain, would consider CT for further characterization. Electronically Signed   By: Franki Cabot M.D.   On: 12/30/2017 20:55   Ct Head Wo Contrast  Result Date: 12/30/2017 CLINICAL DATA:  Patient slipped out of chair and fell on the floor. EXAM: CT HEAD WITHOUT CONTRAST CT CERVICAL SPINE WITHOUT CONTRAST TECHNIQUE: Multidetector CT imaging of the head and cervical spine was performed following the standard protocol without intravenous contrast. Multiplanar CT image reconstructions of the cervical spine were also generated. COMPARISON:  None. FINDINGS: CT HEAD FINDINGS Brain: There is no evidence for acute hemorrhage, hydrocephalus, mass lesion, or abnormal extra-axial fluid collection. No definite CT evidence for acute infarction. Diffuse loss of parenchymal volume is consistent with atrophy. Patchy low attenuation in the deep hemispheric and periventricular white matter is nonspecific, but likely reflects chronic microvascular ischemic demyelination. Vascular: No hyperdense vessel or unexpected calcification. Skull: Normal. Negative for fracture or focal lesion. Sinuses/Orbits:  No acute finding. Other: None. CT CERVICAL SPINE FINDINGS Alignment: Normal. Skull base and vertebrae: No acute fracture. No primary bone lesion or focal pathologic process. Soft tissues and spinal canal: No prevertebral fluid or swelling. No visible canal hematoma. Disc levels:  Loss of disc height noted at C2-3 in C5-6. Upper chest: Biapical pleuroparenchymal scarring. Other: None. IMPRESSION: 1. No acute intracranial abnormality. Atrophy with chronic small vessel white matter ischemic disease. 2. No cervical spine fracture. Degenerative changes at C2-3 and C5-6. Electronically Signed   By: Misty Stanley M.D.   On: 12/30/2017 21:11   Ct Chest W Contrast  Result Date: 12/31/2017 CLINICAL DATA:  [place] out of chair, possible rib fracture  pain to the left hip EXAM: CT CHEST, ABDOMEN, AND PELVIS WITH CONTRAST TECHNIQUE: Multidetector CT imaging of the chest, abdomen and pelvis was performed following the standard protocol during bolus administration of intravenous contrast. CONTRAST:  167mL ISOVUE-300 IOPAMIDOL (ISOVUE-300) INJECTION 61% COMPARISON:  Radiograph 12/30/2017 FINDINGS: CT CHEST FINDINGS Cardiovascular: Nonaneurysmal aorta. Moderate aortic atherosclerosis. Normal heart size. Small pericardial effusion. Mediastinum/Nodes: Negative for hematoma. Midline trachea. Subcentimeter nodules in the right lobe. No significant adenopathy. Esophagus within normal limits Lungs/Pleura: Calcified nodule in the right lower lobe. No acute consolidation or effusion. Apical scarring on the right. Atelectasis at the left base. No pneumothorax Musculoskeletal: Sternum is intact. Old right fourth, fifth, sixth, seventh, eighth, ninth and tenth posterior rib fractures. Age indeterminate moderate compression fractures at T4, T5, T8, T10, T11 and T12. CT ABDOMEN PELVIS FINDINGS Hepatobiliary: No focal liver abnormality is seen. No gallstones, gallbladder wall thickening, or biliary dilatation. Pancreas: Unremarkable. No pancreatic ductal dilatation or surrounding inflammatory changes. Spleen: Calcified granuloma Adrenals/Urinary Tract: Adrenal glands are unremarkable. Kidneys are normal, without renal calculi, focal lesion, or hydronephrosis. Bladder is unremarkable. Stomach/Bowel: Stomach is within normal limits. Appendix appears normal. No evidence of bowel wall thickening, distention, or inflammatory changes. Sigmoid colon diverticular disease without acute inflammation Vascular/Lymphatic: Mild aortic atherosclerosis. No aneurysm. No significantly enlarged lymph nodes Reproductive: Calcified fibroids.  No adnexal mass Other: No free air or free fluid Musculoskeletal: Acute left femoral neck fracture. Age indeterminate mild inferior endplate deformity at L3. Mild  to moderate age indeterminate compression fracture at L5. IMPRESSION: 1. No CT evidence for acute mediastinal injury. No CT evidence for acute solid organ injury within the abdomen or pelvis. No free air or free fluid. 2. Multiple age indeterminate moderate compression fractures T4-T5, T8, T10 through T12, and L3 and L5. No retropulsion. 3. Acute left femoral neck fracture 4. Sigmoid colon diverticular disease without acute inflammation Electronically Signed   By: Donavan Foil M.D.   On: 12/31/2017 00:04   Ct Cervical Spine Wo Contrast  Result Date: 12/30/2017 CLINICAL DATA:  Patient slipped out of chair and fell on the floor. EXAM: CT HEAD WITHOUT CONTRAST CT CERVICAL SPINE WITHOUT CONTRAST TECHNIQUE: Multidetector CT imaging of the head and cervical spine was performed following the standard protocol without intravenous contrast. Multiplanar CT image reconstructions of the cervical spine were also generated. COMPARISON:  None. FINDINGS: CT HEAD FINDINGS Brain: There is no evidence for acute hemorrhage, hydrocephalus, mass lesion, or abnormal extra-axial fluid collection. No definite CT evidence for acute infarction. Diffuse loss of parenchymal volume is consistent with atrophy. Patchy low attenuation in the deep hemispheric and periventricular white matter is nonspecific, but likely reflects chronic microvascular ischemic demyelination. Vascular: No hyperdense vessel or unexpected calcification. Skull: Normal. Negative for fracture or focal lesion. Sinuses/Orbits: No acute finding. Other:  None. CT CERVICAL SPINE FINDINGS Alignment: Normal. Skull base and vertebrae: No acute fracture. No primary bone lesion or focal pathologic process. Soft tissues and spinal canal: No prevertebral fluid or swelling. No visible canal hematoma. Disc levels:  Loss of disc height noted at C2-3 in C5-6. Upper chest: Biapical pleuroparenchymal scarring. Other: None. IMPRESSION: 1. No acute intracranial abnormality. Atrophy with  chronic small vessel white matter ischemic disease. 2. No cervical spine fracture. Degenerative changes at C2-3 and C5-6. Electronically Signed   By: Misty Stanley M.D.   On: 12/30/2017 21:11   Ct Abdomen Pelvis W Contrast  Result Date: 12/31/2017 CLINICAL DATA:  [place] out of chair, possible rib fracture pain to the left hip EXAM: CT CHEST, ABDOMEN, AND PELVIS WITH CONTRAST TECHNIQUE: Multidetector CT imaging of the chest, abdomen and pelvis was performed following the standard protocol during bolus administration of intravenous contrast. CONTRAST:  18mL ISOVUE-300 IOPAMIDOL (ISOVUE-300) INJECTION 61% COMPARISON:  Radiograph 12/30/2017 FINDINGS: CT CHEST FINDINGS Cardiovascular: Nonaneurysmal aorta. Moderate aortic atherosclerosis. Normal heart size. Small pericardial effusion. Mediastinum/Nodes: Negative for hematoma. Midline trachea. Subcentimeter nodules in the right lobe. No significant adenopathy. Esophagus within normal limits Lungs/Pleura: Calcified nodule in the right lower lobe. No acute consolidation or effusion. Apical scarring on the right. Atelectasis at the left base. No pneumothorax Musculoskeletal: Sternum is intact. Old right fourth, fifth, sixth, seventh, eighth, ninth and tenth posterior rib fractures. Age indeterminate moderate compression fractures at T4, T5, T8, T10, T11 and T12. CT ABDOMEN PELVIS FINDINGS Hepatobiliary: No focal liver abnormality is seen. No gallstones, gallbladder wall thickening, or biliary dilatation. Pancreas: Unremarkable. No pancreatic ductal dilatation or surrounding inflammatory changes. Spleen: Calcified granuloma Adrenals/Urinary Tract: Adrenal glands are unremarkable. Kidneys are normal, without renal calculi, focal lesion, or hydronephrosis. Bladder is unremarkable. Stomach/Bowel: Stomach is within normal limits. Appendix appears normal. No evidence of bowel wall thickening, distention, or inflammatory changes. Sigmoid colon diverticular disease without acute  inflammation Vascular/Lymphatic: Mild aortic atherosclerosis. No aneurysm. No significantly enlarged lymph nodes Reproductive: Calcified fibroids.  No adnexal mass Other: No free air or free fluid Musculoskeletal: Acute left femoral neck fracture. Age indeterminate mild inferior endplate deformity at L3. Mild to moderate age indeterminate compression fracture at L5. IMPRESSION: 1. No CT evidence for acute mediastinal injury. No CT evidence for acute solid organ injury within the abdomen or pelvis. No free air or free fluid. 2. Multiple age indeterminate moderate compression fractures T4-T5, T8, T10 through T12, and L3 and L5. No retropulsion. 3. Acute left femoral neck fracture 4. Sigmoid colon diverticular disease without acute inflammation Electronically Signed   By: Donavan Foil M.D.   On: 12/31/2017 00:04   Dg Finger Thumb Left  Result Date: 12/31/2017 CLINICAL DATA:  Fall with thumb pain EXAM: LEFT THUMB 2+V COMPARISON:  None. FINDINGS: Acute nondisplaced intra-articular fracture involving the dorsal base of the first distal phalanx. No subluxation. Mild degenerative change at the PIP joint IMPRESSION: Acute nondisplaced intra-articular fracture involving the base of the first distal phalanx Electronically Signed   By: Donavan Foil M.D.   On: 12/31/2017 00:47   Dg Hip Unilat With Pelvis 2-3 Views Left  Result Date: 12/30/2017 CLINICAL DATA:  Fall, LEFT hip pain. EXAM: DG HIP (WITH OR WITHOUT PELVIS) 2-3V LEFT COMPARISON:  None. FINDINGS: There is a slightly displaced fracture of the LEFT femoral neck, subcapital, with suspected impaction at the fracture site. Femoral head remains appropriately positioned relative to the acetabulum. No additional fracture identified within the osseous pelvis or about  the RIGHT hip. Osteopenia limits characterization of osseous detail. Soft tissues about the LEFT hip are unremarkable. IMPRESSION: Slightly displaced fracture of the LEFT femoral neck, subcapital region.  Electronically Signed   By: Franki Cabot M.D.   On: 12/30/2017 20:51     Assessment/Plan Principal Problem:   Left displaced femoral neck fracture (HCC) Active Problems:   Hypothyroid   Mild dementia   Hip fracture (HCC)    1. Left hip fracture status post fall -exact circumstances of patient's fall is not clear.  Dr. Ninfa Linden has been consulted for surgery.  Patient will be at moderate risk for intermediate risk procedure.  Will keep patient in n.p.o. in anticipation of surgery.  PRN pain medications. 2. Left thumb fracture will follow orthopedic recommendations.  Patient is able to move the thumb. 3. History of hypothyroidism presently not on any medications.  Check TSH. 4. History of dementia not on any medications.  No acute issues. 5. Leukocytosis likely reactionary.  EKG is pending.   DVT prophylaxis: SCDs. Code Status: DNR. Family Communication: Patient has a son and was unable to reach him. Disposition Plan: Probably will need rehab. Consults called: Orthopedics. Admission status: Inpatient.   Rise Patience MD [facility] Pager [phone].  If 7PM-7AM, please contact night-coverage [email or web address] Password [email or web address]  12/31/2017, 2:55 AM

## 2017-12-31 NOTE — Transfer of Care (Signed)
Immediate Anesthesia Transfer of Care Note  Patient: Patricia Gilbert  Procedure(s) Performed: CANNULATED HIP PINNING (Left Hip)  Patient Location: PACU  Anesthesia Type:spinal  Level of Consciousness: drowsy  Airway & Oxygen Therapy: Patient Spontanous Breathing and Patient connected to face mask  Post-op Assessment: Report given to RN and Post -op Vital signs reviewed and stable  Post vital signs: Reviewed and stable  Last Vitals:  Vitals Value Taken Time  BP 138/59 12/31/2017 12:07 PM  Temp    Pulse 81 12/31/2017 12:08 PM  Resp 10 12/31/2017 12:08 PM  SpO2 97 % 12/31/2017 12:08 PM  Vitals shown include unvalidated device data.  Last Pain:  Vitals:   12/31/17 0948  TempSrc: Oral  PainSc:          Complications: No apparent anesthesia complications

## 2017-12-31 NOTE — Progress Notes (Signed)
Patient is a 84 year old female with past medical history of significant Lee advanced dementia, hypothyroidism who was brought from group home after she fell.She was found to have left hip fracture.  Orthopedics has already seen the patient and she has been planned for cannulated hip pinning today .Patient seen and examined at the bedside this morning.  She remains comfortable.  Quite advanced dementia.  Patient seen by Dr. Hal Hope this morning

## 2017-12-31 NOTE — ED Notes (Signed)
ED TO INPATIENT HANDOFF REPORT  Name/Age/Gender Patricia Gilbert 84 y.o. female  Code Status Advance Directive Documentation     Most Recent Value  Type of Advance Directive  Out of facility DNR (pink MOST or yellow form)  Pre-existing out of facility DNR order (yellow form or pink MOST form)  Yellow form placed in chart (order not valid for inpatient use)  "MOST" Form in Place?  -      Home/SNF/Other Nursing Home  Chief Complaint Fall  Level of Care/Admitting Diagnosis ED Disposition    ED Disposition Condition [place]: [place] [100102]  Level of Care: Med-Surg [16]  Diagnosis: Hip fracture [facility]) [[id number]]  Admitting Physician: Rise Patience [phone]  Attending Physician: Rise Patience [phone]  Estimated length of stay: past midnight tomorrow  Certification:: I certify this patient will need inpatient services for at least 2 midnights  PT Class (Do Not Modify): Inpatient [101]  PT Acc Code (Do Not Modify): Private [1]       Medical History Past Medical History:  Diagnosis Date  . Allergic rhinitis, cause unspecified   . Hypothyroid   . Melanoma ([place]) 1997  . Sinus tachycardia   . Syncope 08/20/2011   single event, neg echo and cards eval     Allergies Allergies  Allergen Reactions  . Aspirin     IV Location/Drains/Wounds Patient Lines/Drains/Airways Status   Active Line/Drains/Airways    Name:   Placement date:   Placement time:   Site:   Days:   Peripheral IV 12/30/17 Right Antecubital   12/30/17    2158    Antecubital   1          Labs/Imaging Results for orders placed or performed during the hospital encounter of 12/30/17 (from the past 48 hour(s))  Basic metabolic panel     Status: Abnormal   Collection Time: 12/30/17 10:01 PM  Result Value Ref Range   Sodium 138 135 - 145 mmol/L   Potassium 3.7 3.5 - 5.1 mmol/L   Chloride 105 98 - 111 mmol/L    Comment: Please note change in  reference range.   CO2 25 22 - 32 mmol/L   Glucose, Bld 127 (H) 70 - 99 mg/dL    Comment: Please note change in reference range.   BUN 12 8 - 23 mg/dL    Comment: Please note change in reference range.   Creatinine, Ser 0.59 0.44 - 1.00 mg/dL   Calcium 8.8 (L) 8.9 - 10.3 mg/dL   GFR calc non Af Amer >60 >60 mL/min   GFR calc Af Amer >60 >60 mL/min    Comment: (NOTE) The eGFR has been calculated using the CKD EPI equation. This calculation has not been validated in all clinical situations. eGFR's persistently <60 mL/min signify possible Chronic Kidney Disease.    Anion gap 8 5 - 15    Comment: Performed at [facility] [place], [place] [address], [place], [place] [address]  CBC with Differential     Status: Abnormal   Collection Time: 12/30/17 10:01 PM  Result Value Ref Range   WBC 12.0 (H) 4.0 - 10.5 K/uL   RBC 4.35 3.87 - 5.11 MIL/uL   Hemoglobin 13.1 12.0 - 15.0 g/dL   HCT 40.0 36.0 - 46.0 %   MCV 92.0 78.0 - 100.0 fL   MCH 30.1 26.0 - 34.0 pg   MCHC 32.8 30.0 - 36.0 g/dL   RDW 14.6 11.5 -  15.5 %   Platelets 310 150 - 400 K/uL   Neutrophils Relative % 87 %   Neutro Abs 10.5 (H) 1.7 - 7.7 K/uL   Lymphocytes Relative 8 %   Lymphs Abs 0.9 0.7 - 4.0 K/uL   Monocytes Relative 5 %   Monocytes Absolute 0.6 0.1 - 1.0 K/uL   Eosinophils Relative 0 %   Eosinophils Absolute 0.0 0.0 - 0.7 K/uL   Basophils Relative 0 %   Basophils Absolute 0.0 0.0 - 0.1 K/uL    Comment: Performed at [facility], [place] [address], [place], [place] [address]   Dg Chest 2 View  Result Date: 12/30/2017 CLINICAL DATA:  Fall, LEFT hip pain. EXAM: CHEST - 2 VIEW COMPARISON:  None. FINDINGS: Mild cardiomegaly. No confluent opacity to suggest a developing pneumonia. No pleural effusion or pneumothorax seen. Probable atelectasis or confluent scarring at the LEFT costophrenic angle. Multiple RIGHT-sided rib fractures, of uncertain age, most likely chronic. Multiple compression fracture  deformities within the mid and lower thoracic spine, of uncertain chronicity. IMPRESSION: 1. No active cardiopulmonary disease. No evidence of pneumonia or pulmonary edema. 2. Mild cardiomegaly. 3. Multiple RIGHT-sided rib fractures, of uncertain age, favor chronic. 4. Multiple compression fracture deformities within the mid and lower thoracic spine, of uncertain age but also most likely chronic. If any midline back pain, would consider CT for further characterization. Electronically Signed   By: Franki Cabot M.D.   On: 12/30/2017 20:55   Ct Head Wo Contrast  Result Date: 12/30/2017 CLINICAL DATA:  Patient slipped out of chair and fell on the floor. EXAM: CT HEAD WITHOUT CONTRAST CT CERVICAL SPINE WITHOUT CONTRAST TECHNIQUE: Multidetector CT imaging of the head and cervical spine was performed following the standard protocol without intravenous contrast. Multiplanar CT image reconstructions of the cervical spine were also generated. COMPARISON:  None. FINDINGS: CT HEAD FINDINGS Brain: There is no evidence for acute hemorrhage, hydrocephalus, mass lesion, or abnormal extra-axial fluid collection. No definite CT evidence for acute infarction. Diffuse loss of parenchymal volume is consistent with atrophy. Patchy low attenuation in the deep hemispheric and periventricular white matter is nonspecific, but likely reflects chronic microvascular ischemic demyelination. Vascular: No hyperdense vessel or unexpected calcification. Skull: Normal. Negative for fracture or focal lesion. Sinuses/Orbits: No acute finding. Other: None. CT CERVICAL SPINE FINDINGS Alignment: Normal. Skull base and vertebrae: No acute fracture. No primary bone lesion or focal pathologic process. Soft tissues and spinal canal: No prevertebral fluid or swelling. No visible canal hematoma. Disc levels:  Loss of disc height noted at C2-3 in C5-6. Upper chest: Biapical pleuroparenchymal scarring. Other: None. IMPRESSION: 1. No acute intracranial  abnormality. Atrophy with chronic small vessel white matter ischemic disease. 2. No cervical spine fracture. Degenerative changes at C2-3 and C5-6. Electronically Signed   By: Misty Stanley M.D.   On: 12/30/2017 21:11   Ct Chest W Contrast  Result Date: 12/31/2017 CLINICAL DATA:  [place] out of chair, possible rib fracture pain to the left hip EXAM: CT CHEST, ABDOMEN, AND PELVIS WITH CONTRAST TECHNIQUE: Multidetector CT imaging of the chest, abdomen and pelvis was performed following the standard protocol during bolus administration of intravenous contrast. CONTRAST:  128m ISOVUE-300 IOPAMIDOL (ISOVUE-300) INJECTION 61% COMPARISON:  Radiograph 12/30/2017 FINDINGS: CT CHEST FINDINGS Cardiovascular: Nonaneurysmal aorta. Moderate aortic atherosclerosis. Normal heart size. Small pericardial effusion. Mediastinum/Nodes: Negative for hematoma. Midline trachea. Subcentimeter nodules in the right lobe. No significant adenopathy. Esophagus within normal limits Lungs/Pleura: Calcified nodule in the right lower lobe.  No acute consolidation or effusion. Apical scarring on the right. Atelectasis at the left base. No pneumothorax Musculoskeletal: Sternum is intact. Old right fourth, fifth, sixth, seventh, eighth, ninth and tenth posterior rib fractures. Age indeterminate moderate compression fractures at T4, T5, T8, T10, T11 and T12. CT ABDOMEN PELVIS FINDINGS Hepatobiliary: No focal liver abnormality is seen. No gallstones, gallbladder wall thickening, or biliary dilatation. Pancreas: Unremarkable. No pancreatic ductal dilatation or surrounding inflammatory changes. Spleen: Calcified granuloma Adrenals/Urinary Tract: Adrenal glands are unremarkable. Kidneys are normal, without renal calculi, focal lesion, or hydronephrosis. Bladder is unremarkable. Stomach/Bowel: Stomach is within normal limits. Appendix appears normal. No evidence of bowel wall thickening, distention, or inflammatory changes. Sigmoid colon diverticular disease  without acute inflammation Vascular/Lymphatic: Mild aortic atherosclerosis. No aneurysm. No significantly enlarged lymph nodes Reproductive: Calcified fibroids.  No adnexal mass Other: No free air or free fluid Musculoskeletal: Acute left femoral neck fracture. Age indeterminate mild inferior endplate deformity at L3. Mild to moderate age indeterminate compression fracture at L5. IMPRESSION: 1. No CT evidence for acute mediastinal injury. No CT evidence for acute solid organ injury within the abdomen or pelvis. No free air or free fluid. 2. Multiple age indeterminate moderate compression fractures T4-T5, T8, T10 through T12, and L3 and L5. No retropulsion. 3. Acute left femoral neck fracture 4. Sigmoid colon diverticular disease without acute inflammation Electronically Signed   By: Donavan Foil M.D.   On: 12/31/2017 00:04   Ct Cervical Spine Wo Contrast  Result Date: 12/30/2017 CLINICAL DATA:  Patient slipped out of chair and fell on the floor. EXAM: CT HEAD WITHOUT CONTRAST CT CERVICAL SPINE WITHOUT CONTRAST TECHNIQUE: Multidetector CT imaging of the head and cervical spine was performed following the standard protocol without intravenous contrast. Multiplanar CT image reconstructions of the cervical spine were also generated. COMPARISON:  None. FINDINGS: CT HEAD FINDINGS Brain: There is no evidence for acute hemorrhage, hydrocephalus, mass lesion, or abnormal extra-axial fluid collection. No definite CT evidence for acute infarction. Diffuse loss of parenchymal volume is consistent with atrophy. Patchy low attenuation in the deep hemispheric and periventricular white matter is nonspecific, but likely reflects chronic microvascular ischemic demyelination. Vascular: No hyperdense vessel or unexpected calcification. Skull: Normal. Negative for fracture or focal lesion. Sinuses/Orbits: No acute finding. Other: None. CT CERVICAL SPINE FINDINGS Alignment: Normal. Skull base and vertebrae: No acute fracture. No  primary bone lesion or focal pathologic process. Soft tissues and spinal canal: No prevertebral fluid or swelling. No visible canal hematoma. Disc levels:  Loss of disc height noted at C2-3 in C5-6. Upper chest: Biapical pleuroparenchymal scarring. Other: None. IMPRESSION: 1. No acute intracranial abnormality. Atrophy with chronic small vessel white matter ischemic disease. 2. No cervical spine fracture. Degenerative changes at C2-3 and C5-6. Electronically Signed   By: Misty Stanley M.D.   On: 12/30/2017 21:11   Ct Abdomen Pelvis W Contrast  Result Date: 12/31/2017 CLINICAL DATA:  [place] out of chair, possible rib fracture pain to the left hip EXAM: CT CHEST, ABDOMEN, AND PELVIS WITH CONTRAST TECHNIQUE: Multidetector CT imaging of the chest, abdomen and pelvis was performed following the standard protocol during bolus administration of intravenous contrast. CONTRAST:  174m ISOVUE-300 IOPAMIDOL (ISOVUE-300) INJECTION 61% COMPARISON:  Radiograph 12/30/2017 FINDINGS: CT CHEST FINDINGS Cardiovascular: Nonaneurysmal aorta. Moderate aortic atherosclerosis. Normal heart size. Small pericardial effusion. Mediastinum/Nodes: Negative for hematoma. Midline trachea. Subcentimeter nodules in the right lobe. No significant adenopathy. Esophagus within normal limits Lungs/Pleura: Calcified nodule in the right lower lobe. No acute consolidation  or effusion. Apical scarring on the right. Atelectasis at the left base. No pneumothorax Musculoskeletal: Sternum is intact. Old right fourth, fifth, sixth, seventh, eighth, ninth and tenth posterior rib fractures. Age indeterminate moderate compression fractures at T4, T5, T8, T10, T11 and T12. CT ABDOMEN PELVIS FINDINGS Hepatobiliary: No focal liver abnormality is seen. No gallstones, gallbladder wall thickening, or biliary dilatation. Pancreas: Unremarkable. No pancreatic ductal dilatation or surrounding inflammatory changes. Spleen: Calcified granuloma Adrenals/Urinary Tract: Adrenal  glands are unremarkable. Kidneys are normal, without renal calculi, focal lesion, or hydronephrosis. Bladder is unremarkable. Stomach/Bowel: Stomach is within normal limits. Appendix appears normal. No evidence of bowel wall thickening, distention, or inflammatory changes. Sigmoid colon diverticular disease without acute inflammation Vascular/Lymphatic: Mild aortic atherosclerosis. No aneurysm. No significantly enlarged lymph nodes Reproductive: Calcified fibroids.  No adnexal mass Other: No free air or free fluid Musculoskeletal: Acute left femoral neck fracture. Age indeterminate mild inferior endplate deformity at L3. Mild to moderate age indeterminate compression fracture at L5. IMPRESSION: 1. No CT evidence for acute mediastinal injury. No CT evidence for acute solid organ injury within the abdomen or pelvis. No free air or free fluid. 2. Multiple age indeterminate moderate compression fractures T4-T5, T8, T10 through T12, and L3 and L5. No retropulsion. 3. Acute left femoral neck fracture 4. Sigmoid colon diverticular disease without acute inflammation Electronically Signed   By: Donavan Foil M.D.   On: 12/31/2017 00:04   Dg Finger Thumb Left  Result Date: 12/31/2017 CLINICAL DATA:  Fall with thumb pain EXAM: LEFT THUMB 2+V COMPARISON:  None. FINDINGS: Acute nondisplaced intra-articular fracture involving the dorsal base of the first distal phalanx. No subluxation. Mild degenerative change at the PIP joint IMPRESSION: Acute nondisplaced intra-articular fracture involving the base of the first distal phalanx Electronically Signed   By: Donavan Foil M.D.   On: 12/31/2017 00:47   Dg Hip Unilat With Pelvis 2-3 Views Left  Result Date: 12/30/2017 CLINICAL DATA:  Fall, LEFT hip pain. EXAM: DG HIP (WITH OR WITHOUT PELVIS) 2-3V LEFT COMPARISON:  None. FINDINGS: There is a slightly displaced fracture of the LEFT femoral neck, subcapital, with suspected impaction at the fracture site. Femoral head remains  appropriately positioned relative to the acetabulum. No additional fracture identified within the osseous pelvis or about the RIGHT hip. Osteopenia limits characterization of osseous detail. Soft tissues about the LEFT hip are unremarkable. IMPRESSION: Slightly displaced fracture of the LEFT femoral neck, subcapital region. Electronically Signed   By: Franki Cabot M.D.   On: 12/30/2017 20:51    Pending Labs Unresulted Labs (From admission, onward)   None      Vitals/Pain Today's Vitals   12/30/17 2015 12/30/17 2150 12/30/17 2337 12/31/17 0006  BP: (!) 165/54 (!) 155/79 (!) 188/73 (!) 179/74  Pulse: 94 (!) 102 (!) 108 (!) 103  Resp: '17 16 16 17  ' Temp:      TempSrc:      SpO2: 96% 93% 93% 94%  PainSc:        Isolation Precautions No active isolations  Medications Medications  Tdap (BOOSTRIX) injection 0.5 mL (0.5 mLs Intramuscular Given 12/30/17 2150)  iopamidol (ISOVUE-300) 61 % injection 100 mL (100 mLs Intravenous Contrast Given 12/30/17 2326)    Mobility non-ambulatory

## 2017-12-31 NOTE — Consult Note (Signed)
Reason for Consult:  Left hip fracture Referring Physician: Dr. Rex Kras, EDP  Patricia Gilbert is an 84 y.o. female.  HPI: The patient is an 84 year old nursing home resident who sustained a mechanical fall out of her chair yesterday.  After complaints of left hip pain she was sent to the [facility] emergency room.  She was found to have a minimally displaced left hip femoral neck fracture.  She also was noted to have a left thumb distal phalanx fracture.  She has old thoracic spine compression fractures.  She was admitted to the medicine service and orthopedic surgery is consulted for evaluation treatment recommendations as it relates to the left hip fracture and the left thumb fracture.  The patient is awake but obviously has some dementia and expresses paranoid types of thoughts stating she does not want me to talk to any family.  I have tried to contact her son Gershon Mussel who is listed in the ED please note and left him a voicemail on his phone to contact me at his earliest convenience.  Past Medical History:  Diagnosis Date  . Allergic rhinitis, cause unspecified   . Hypothyroid   . Melanoma ([place]) 1997  . Sinus tachycardia   . Syncope 08/20/2011   single event, neg echo and cards eval     Past Surgical History:  Procedure Laterality Date  . CESAREAN SECTION     [id number]  . MELANOMA EXCISION  1997    Family History  Problem Relation Age of Onset  . Prostate cancer Father   . Diabetes Other   . Stroke Other     Social History:  reports that she has never smoked. She has never used smokeless tobacco. She reports that she drinks alcohol. She reports that she does not use drugs.  Allergies:  Allergies  Allergen Reactions  . Aspirin     Medications: I have reviewed the patient's current medications.  Results for orders placed or performed during the hospital encounter of 12/30/17 (from the past 48 hour(s))  Basic metabolic panel     Status: Abnormal   Collection Time: 12/30/17  10:01 PM  Result Value Ref Range   Sodium 138 135 - 145 mmol/L   Potassium 3.7 3.5 - 5.1 mmol/L   Chloride 105 98 - 111 mmol/L    Comment: Please note change in reference range.   CO2 25 22 - 32 mmol/L   Glucose, Bld 127 (H) 70 - 99 mg/dL    Comment: Please note change in reference range.   BUN 12 8 - 23 mg/dL    Comment: Please note change in reference range.   Creatinine, Ser 0.59 0.44 - 1.00 mg/dL   Calcium 8.8 (L) 8.9 - 10.3 mg/dL   GFR calc non Af Amer >60 >60 mL/min   GFR calc Af Amer >60 >60 mL/min    Comment: (NOTE) The eGFR has been calculated using the CKD EPI equation. This calculation has not been validated in all clinical situations. eGFR's persistently <60 mL/min signify possible Chronic Kidney Disease.    Anion gap 8 5 - 15    Comment: Performed at [facility], [place] [address], [place],  [address]  CBC with Differential     Status: Abnormal   Collection Time: 12/30/17 10:01 PM  Result Value Ref Range   WBC 12.0 (H) 4.0 - 10.5 K/uL   RBC 4.35 3.87 - 5.11 MIL/uL   Hemoglobin 13.1 12.0 - 15.0 g/dL   HCT 40.0 36.0 -  46.0 %   MCV 92.0 78.0 - 100.0 fL   MCH 30.1 26.0 - 34.0 pg   MCHC 32.8 30.0 - 36.0 g/dL   RDW 14.6 11.5 - 15.5 %   Platelets 310 150 - 400 K/uL   Neutrophils Relative % 87 %   Neutro Abs 10.5 (H) 1.7 - 7.7 K/uL   Lymphocytes Relative 8 %   Lymphs Abs 0.9 0.7 - 4.0 K/uL   Monocytes Relative 5 %   Monocytes Absolute 0.6 0.1 - 1.0 K/uL   Eosinophils Relative 0 %   Eosinophils Absolute 0.0 0.0 - 0.7 K/uL   Basophils Relative 0 %   Basophils Absolute 0.0 0.0 - 0.1 K/uL    Comment: Performed at [facility], [place] [address], [place], [place] [address]  ABO/Rh     Status: None   Collection Time: 12/31/17  3:33 AM  Result Value Ref Range   ABO/RH(D)      A NEG Performed at [facility] [address] [place], [place] [address]   CBC WITH DIFFERENTIAL     Status: Abnormal   Collection  Time: 12/31/17  3:37 AM  Result Value Ref Range   WBC 9.8 4.0 - 10.5 K/uL   RBC 4.25 3.87 - 5.11 MIL/uL   Hemoglobin 12.9 12.0 - 15.0 g/dL   HCT 38.6 36.0 - 46.0 %   MCV 90.8 78.0 - 100.0 fL   MCH 30.4 26.0 - 34.0 pg   MCHC 33.4 30.0 - 36.0 g/dL   RDW 14.5 11.5 - 15.5 %   Platelets 286 150 - 400 K/uL   Neutrophils Relative % 89 %   Neutro Abs 8.7 (H) 1.7 - 7.7 K/uL   Lymphocytes Relative 7 %   Lymphs Abs 0.7 0.7 - 4.0 K/uL   Monocytes Relative 4 %   Monocytes Absolute 0.4 0.1 - 1.0 K/uL   Eosinophils Relative 0 %   Eosinophils Absolute 0.0 0.0 - 0.7 K/uL   Basophils Relative 0 %   Basophils Absolute 0.0 0.0 - 0.1 K/uL    Comment: Performed at [facility], [place] [address] [place], [place] [address]  Comprehensive metabolic panel     Status: Abnormal   Collection Time: 12/31/17  3:37 AM  Result Value Ref Range   Sodium 137 135 - 145 mmol/L   Potassium 3.8 3.5 - 5.1 mmol/L   Chloride 105 98 - 111 mmol/L    Comment: Please note change in reference range.   CO2 24 22 - 32 mmol/L   Glucose, Bld 137 (H) 70 - 99 mg/dL    Comment: Please note change in reference range.   BUN 10 8 - 23 mg/dL    Comment: Please note change in reference range.   Creatinine, Ser 0.47 0.44 - 1.00 mg/dL   Calcium 8.7 (L) 8.9 - 10.3 mg/dL   Total Protein 6.6 6.5 - 8.1 g/dL   Albumin 3.5 3.5 - 5.0 g/dL   AST 18 15 - 41 U/L   ALT 13 0 - 44 U/L    Comment: Please note change in reference range.   Alkaline Phosphatase 75 38 - 126 U/L   Total Bilirubin 0.9 0.3 - 1.2 mg/dL   GFR calc non Af Amer >60 >60 mL/min   GFR calc Af Amer >60 >60 mL/min    Comment: (NOTE) The eGFR has been calculated using the CKD EPI equation. This calculation has not been validated in all clinical situations. eGFR's persistently <60  mL/min signify possible Chronic Kidney Disease.    Anion gap 8 5 - 15    Comment: Performed at [facility], [place] [address], [place], [place] [address]  Type  and screen [place]     Status: None   Collection Time: 12/31/17  3:37 AM  Result Value Ref Range   ABO/RH(D) A NEG    Antibody Screen NEG    Sample Expiration      01/03/2018 Performed at [facility], [place] [address], [place], [place] [address]   TSH     Status: Abnormal   Collection Time: 12/31/17  3:37 AM  Result Value Ref Range   TSH 4.814 (H) 0.350 - 4.500 uIU/mL    Comment: Performed by a 3rd Generation assay with a functional sensitivity of <=0.01 uIU/mL. Performed at [facility], [place] [address], [place], [place] [address]     Dg Chest 2 View  Result Date: 12/30/2017 CLINICAL DATA:  Fall, LEFT hip pain. EXAM: CHEST - 2 VIEW COMPARISON:  None. FINDINGS: Mild cardiomegaly. No confluent opacity to suggest a developing pneumonia. No pleural effusion or pneumothorax seen. Probable atelectasis or confluent scarring at the LEFT costophrenic angle. Multiple RIGHT-sided rib fractures, of uncertain age, most likely chronic. Multiple compression fracture deformities within the mid and lower thoracic spine, of uncertain chronicity. IMPRESSION: 1. No active cardiopulmonary disease. No evidence of pneumonia or pulmonary edema. 2. Mild cardiomegaly. 3. Multiple RIGHT-sided rib fractures, of uncertain age, favor chronic. 4. Multiple compression fracture deformities within the mid and lower thoracic spine, of uncertain age but also most likely chronic. If any midline back pain, would consider CT for further characterization. Electronically Signed   By: Franki Cabot M.D.   On: 12/30/2017 20:55   Ct Head Wo Contrast  Result Date: 12/30/2017 CLINICAL DATA:  Patient slipped out of chair and fell on the floor. EXAM: CT HEAD WITHOUT CONTRAST CT CERVICAL SPINE WITHOUT CONTRAST TECHNIQUE: Multidetector CT imaging of the head and cervical spine was performed following the standard protocol without intravenous contrast. Multiplanar CT image  reconstructions of the cervical spine were also generated. COMPARISON:  None. FINDINGS: CT HEAD FINDINGS Brain: There is no evidence for acute hemorrhage, hydrocephalus, mass lesion, or abnormal extra-axial fluid collection. No definite CT evidence for acute infarction. Diffuse loss of parenchymal volume is consistent with atrophy. Patchy low attenuation in the deep hemispheric and periventricular white matter is nonspecific, but likely reflects chronic microvascular ischemic demyelination. Vascular: No hyperdense vessel or unexpected calcification. Skull: Normal. Negative for fracture or focal lesion. Sinuses/Orbits: No acute finding. Other: None. CT CERVICAL SPINE FINDINGS Alignment: Normal. Skull base and vertebrae: No acute fracture. No primary bone lesion or focal pathologic process. Soft tissues and spinal canal: No prevertebral fluid or swelling. No visible canal hematoma. Disc levels:  Loss of disc height noted at C2-3 in C5-6. Upper chest: Biapical pleuroparenchymal scarring. Other: None. IMPRESSION: 1. No acute intracranial abnormality. Atrophy with chronic small vessel white matter ischemic disease. 2. No cervical spine fracture. Degenerative changes at C2-3 and C5-6. Electronically Signed   By: Misty Stanley M.D.   On: 12/30/2017 21:11   Ct Chest W Contrast  Result Date: 12/31/2017 CLINICAL DATA:  [place] out of chair, possible rib fracture pain to the left hip EXAM: CT CHEST, ABDOMEN, AND PELVIS WITH CONTRAST TECHNIQUE: Multidetector CT imaging of the chest, abdomen and pelvis was performed following the standard protocol during bolus administration of intravenous contrast. CONTRAST:  124m ISOVUE-300 IOPAMIDOL (ISOVUE-300) INJECTION 61% COMPARISON:  Radiograph 12/30/2017 FINDINGS: CT CHEST FINDINGS Cardiovascular: Nonaneurysmal aorta. Moderate aortic atherosclerosis. Normal heart size. Small pericardial effusion. Mediastinum/Nodes: Negative for hematoma. Midline trachea. Subcentimeter nodules in the  right lobe. No significant adenopathy. Esophagus within normal limits Lungs/Pleura: Calcified nodule in the right lower lobe. No acute consolidation or effusion. Apical scarring on the right. Atelectasis at the left base. No pneumothorax Musculoskeletal: Sternum is intact. Old right fourth, fifth, sixth, seventh, eighth, ninth and tenth posterior rib fractures. Age indeterminate moderate compression fractures at T4, T5, T8, T10, T11 and T12. CT ABDOMEN PELVIS FINDINGS Hepatobiliary: No focal liver abnormality is seen. No gallstones, gallbladder wall thickening, or biliary dilatation. Pancreas: Unremarkable. No pancreatic ductal dilatation or surrounding inflammatory changes. Spleen: Calcified granuloma Adrenals/Urinary Tract: Adrenal glands are unremarkable. Kidneys are normal, without renal calculi, focal lesion, or hydronephrosis. Bladder is unremarkable. Stomach/Bowel: Stomach is within normal limits. Appendix appears normal. No evidence of bowel wall thickening, distention, or inflammatory changes. Sigmoid colon diverticular disease without acute inflammation Vascular/Lymphatic: Mild aortic atherosclerosis. No aneurysm. No significantly enlarged lymph nodes Reproductive: Calcified fibroids.  No adnexal mass Other: No free air or free fluid Musculoskeletal: Acute left femoral neck fracture. Age indeterminate mild inferior endplate deformity at L3. Mild to moderate age indeterminate compression fracture at L5. IMPRESSION: 1. No CT evidence for acute mediastinal injury. No CT evidence for acute solid organ injury within the abdomen or pelvis. No free air or free fluid. 2. Multiple age indeterminate moderate compression fractures T4-T5, T8, T10 through T12, and L3 and L5. No retropulsion. 3. Acute left femoral neck fracture 4. Sigmoid colon diverticular disease without acute inflammation Electronically Signed   By: KDonavan FoilM.D.   On: 12/31/2017 00:04   Ct Cervical Spine Wo Contrast  Result Date:  12/30/2017 CLINICAL DATA:  Patient slipped out of chair and fell on the floor. EXAM: CT HEAD WITHOUT CONTRAST CT CERVICAL SPINE WITHOUT CONTRAST TECHNIQUE: Multidetector CT imaging of the head and cervical spine was performed following the standard protocol without intravenous contrast. Multiplanar CT image reconstructions of the cervical spine were also generated. COMPARISON:  None. FINDINGS: CT HEAD FINDINGS Brain: There is no evidence for acute hemorrhage, hydrocephalus, mass lesion, or abnormal extra-axial fluid collection. No definite CT evidence for acute infarction. Diffuse loss of parenchymal volume is consistent with atrophy. Patchy low attenuation in the deep hemispheric and periventricular white matter is nonspecific, but likely reflects chronic microvascular ischemic demyelination. Vascular: No hyperdense vessel or unexpected calcification. Skull: Normal. Negative for fracture or focal lesion. Sinuses/Orbits: No acute finding. Other: None. CT CERVICAL SPINE FINDINGS Alignment: Normal. Skull base and vertebrae: No acute fracture. No primary bone lesion or focal pathologic process. Soft tissues and spinal canal: No prevertebral fluid or swelling. No visible canal hematoma. Disc levels:  Loss of disc height noted at C2-3 in C5-6. Upper chest: Biapical pleuroparenchymal scarring. Other: None. IMPRESSION: 1. No acute intracranial abnormality. Atrophy with chronic small vessel white matter ischemic disease. 2. No cervical spine fracture. Degenerative changes at C2-3 and C5-6. Electronically Signed   By: EMisty StanleyM.D.   On: 12/30/2017 21:11   Ct Abdomen Pelvis W Contrast  Result Date: 12/31/2017 CLINICAL DATA:  F[place]en Circleout of chair, possible rib fracture pain to the left hip EXAM: CT CHEST, ABDOMEN, AND PELVIS WITH CONTRAST TECHNIQUE: Multidetector CT imaging of the chest, abdomen and pelvis was performed following the standard protocol during bolus administration of intravenous contrast. CONTRAST:  1083m ISOVUE-300  IOPAMIDOL (ISOVUE-300) INJECTION 61% COMPARISON:  Radiograph 12/30/2017 FINDINGS: CT CHEST FINDINGS Cardiovascular: Nonaneurysmal aorta. Moderate aortic atherosclerosis. Normal heart size. Small pericardial effusion. Mediastinum/Nodes: Negative for hematoma. Midline trachea. Subcentimeter nodules in the right lobe. No significant adenopathy. Esophagus within normal limits Lungs/Pleura: Calcified nodule in the right lower lobe. No acute consolidation or effusion. Apical scarring on the right. Atelectasis at the left base. No pneumothorax Musculoskeletal: Sternum is intact. Old right fourth, fifth, sixth, seventh, eighth, ninth and tenth posterior rib fractures. Age indeterminate moderate compression fractures at T4, T5, T8, T10, T11 and T12. CT ABDOMEN PELVIS FINDINGS Hepatobiliary: No focal liver abnormality is seen. No gallstones, gallbladder wall thickening, or biliary dilatation. Pancreas: Unremarkable. No pancreatic ductal dilatation or surrounding inflammatory changes. Spleen: Calcified granuloma Adrenals/Urinary Tract: Adrenal glands are unremarkable. Kidneys are normal, without renal calculi, focal lesion, or hydronephrosis. Bladder is unremarkable. Stomach/Bowel: Stomach is within normal limits. Appendix appears normal. No evidence of bowel wall thickening, distention, or inflammatory changes. Sigmoid colon diverticular disease without acute inflammation Vascular/Lymphatic: Mild aortic atherosclerosis. No aneurysm. No significantly enlarged lymph nodes Reproductive: Calcified fibroids.  No adnexal mass Other: No free air or free fluid Musculoskeletal: Acute left femoral neck fracture. Age indeterminate mild inferior endplate deformity at L3. Mild to moderate age indeterminate compression fracture at L5. IMPRESSION: 1. No CT evidence for acute mediastinal injury. No CT evidence for acute solid organ injury within the abdomen or pelvis. No free air or free fluid. 2. Multiple age indeterminate  moderate compression fractures T4-T5, T8, T10 through T12, and L3 and L5. No retropulsion. 3. Acute left femoral neck fracture 4. Sigmoid colon diverticular disease without acute inflammation Electronically Signed   By: Donavan Foil M.D.   On: 12/31/2017 00:04   Dg Finger Thumb Left  Result Date: 12/31/2017 CLINICAL DATA:  Fall with thumb pain EXAM: LEFT THUMB 2+V COMPARISON:  None. FINDINGS: Acute nondisplaced intra-articular fracture involving the dorsal base of the first distal phalanx. No subluxation. Mild degenerative change at the PIP joint IMPRESSION: Acute nondisplaced intra-articular fracture involving the base of the first distal phalanx Electronically Signed   By: Donavan Foil M.D.   On: 12/31/2017 00:47   Dg Hip Unilat With Pelvis 2-3 Views Left  Result Date: 12/30/2017 CLINICAL DATA:  Fall, LEFT hip pain. EXAM: DG HIP (WITH OR WITHOUT PELVIS) 2-3V LEFT COMPARISON:  None. FINDINGS: There is a slightly displaced fracture of the LEFT femoral neck, subcapital, with suspected impaction at the fracture site. Femoral head remains appropriately positioned relative to the acetabulum. No additional fracture identified within the osseous pelvis or about the RIGHT hip. Osteopenia limits characterization of osseous detail. Soft tissues about the LEFT hip are unremarkable. IMPRESSION: Slightly displaced fracture of the LEFT femoral neck, subcapital region. Electronically Signed   By: Franki Cabot M.D.   On: 12/30/2017 20:51    ROS Blood pressure (!) 143/50, pulse 87, temperature 98.2 F (36.8 C), temperature source Oral, resp. rate 16, SpO2 95 %. Physical Exam  Constitutional: She appears well-developed and well-nourished.  HENT:  Head: Normocephalic and atraumatic.  Eyes: Pupils are equal, round, and reactive to light.  Neck: Normal range of motion.  Cardiovascular: Normal rate.  Respiratory: Effort normal.  GI: Soft.  Musculoskeletal:       Left hip: She exhibits decreased range of motion,  decreased strength and bony tenderness.  Neurological: She is alert.  Skin: Skin is warm and dry.    Assessment/Plan: Left hip minimally displaced femoral neck fracture and left  thumb distal phalanx fracture  #1 my recommendation for treating her left hip fracture would be to try to keep this minimal with compression screws across the fracture.  The left thumb fracture could be treated without really anything since it is such a minimal break of the distal phalanx.  For now we will let her eat and drink because I cannot get a hold of any family members for consent for surgery.  I have left a message with her son Gershon Mussel to give me a call.  Hopefully we would then be able to obtain consent for surgery through her healthcare power of attorney.  Unfortunately we will hold on surgery for now due to the inability to obtain consent for this surgery.  She is to remain nonweightbearing on her left hip.  Mcarthur Rossetti 12/31/2017, 8:32 AM

## 2017-12-31 NOTE — Anesthesia Preprocedure Evaluation (Addendum)
Anesthesia Evaluation  Patient identified by MRN, date of birth, ID band Patient confused    Reviewed: Allergy & Precautions, NPO status , Patient's Chart, lab work & pertinent test results  Airway Mallampati: II  TM Distance: >3 FB Neck ROM: Full    Dental   Pulmonary neg pulmonary ROS,    Pulmonary exam normal        Cardiovascular negative cardio ROS   Rhythm:Regular Rate:Tachycardia  Study Conclusions 2013  - Left ventricle: The cavity size was normal. Wall thickness was normal. Systolic function was normal. The estimated ejection fraction was in the range of 55% to 60%. Wall motion was normal; there were no regional wall motion abnormalities. Doppler parameters are consistent with abnormal left ventricular relaxation (grade 1 diastolic dysfunction). - Aortic valve: Mild regurgitation.   Neuro/Psych PSYCHIATRIC DISORDERS Dementia negative neurological ROS     GI/Hepatic negative GI ROS, Neg liver ROS,   Endo/Other  Hypothyroidism   Renal/GU negative Renal ROS  negative genitourinary   Musculoskeletal   Abdominal   Peds negative pediatric ROS (+)  Hematology negative hematology ROS (+)   Anesthesia Other Findings   Reproductive/Obstetrics negative OB ROS                           Anesthesia Physical Anesthesia Plan  ASA: III  Anesthesia Plan: Spinal   Post-op Pain Management:    Induction:   PONV Risk Score and Plan: 2 and Ondansetron and Propofol infusion  Airway Management Planned: Natural Airway  Additional Equipment:   Intra-op Plan:   Post-operative Plan:   Informed Consent: I have reviewed the patients History and Physical, chart, labs and discussed the procedure including the risks, benefits and alternatives for the proposed anesthesia with the patient or authorized representative who has indicated his/her understanding and acceptance.   Consent  reviewed with POA  Plan Discussed with: CRNA and Anesthesiologist  Anesthesia Plan Comments:        Anesthesia Quick Evaluation

## 2017-12-31 NOTE — Anesthesia Procedure Notes (Signed)
Spinal  Patient location during procedure: OR Start time: 12/31/2017 11:09 AM End time: 12/31/2017 11:17 AM Staffing Anesthesiologist: Duane Boston, MD Performed: anesthesiologist  Preanesthetic Checklist Completed: patient identified, surgical consent, pre-op evaluation, timeout performed, IV checked, risks and benefits discussed and monitors and equipment checked Spinal Block Patient position: left lateral decubitus Prep: DuraPrep Patient monitoring: cardiac monitor, continuous pulse ox and blood pressure Approach: midline Location: L2-3 Injection technique: single-shot Needle Needle type: Quincke  Needle gauge: 22 G Needle length: 9 cm Additional Notes Functioning IV was confirmed and monitors were applied. Sterile prep and drape, including hand hygiene and sterile gloves were used. The patient was positioned and the spine was prepped. The skin was anesthetized with lidocaine.  Free flow of clear CSF was obtained prior to injecting local anesthetic into the CSF.  The spinal needle aspirated freely following injection.  The needle was carefully withdrawn.  The patient tolerated the procedure well.

## 2017-12-31 NOTE — Brief Op Note (Signed)
12/31/2017  12:06 PM  PATIENT:  Patricia Gilbert  84 y.o. female  PRE-OPERATIVE DIAGNOSIS:  left hip fracture  POST-OPERATIVE DIAGNOSIS:  left hip fracture  PROCEDURE:  Procedure(s): CANNULATED HIP PINNING (Left)  SURGEON:  Surgeon(s) and Role:    Mcarthur Rossetti, MD - Primary  PHYSICIAN ASSISTANT: Benita Stabile, PA-C  ANESTHESIA:   spinal  EBL:  10 mL   COUNTS:  YES   DICTATION: .Other Dictation: Dictation Number [id number]  PLAN OF CARE: Admit to inpatient   PATIENT DISPOSITION:  PACU - hemodynamically stable.   Delay start of Pharmacological VTE agent (>24hrs) due to surgical blood loss or risk of bleeding: no

## 2018-01-01 MED ORDER — ENOXAPARIN SODIUM 30 MG/0.3ML ~~LOC~~ SOLN
30.0000 mg | SUBCUTANEOUS | Status: DC
  Administered 2018-01-01 – 2018-01-02 (×2): 30 mg via SUBCUTANEOUS
  Filled 2018-01-01 (×2): qty 0.3

## 2018-01-01 MED ORDER — ENSURE ENLIVE PO LIQD
237.0000 mL | Freq: Two times a day (BID) | ORAL | Status: DC
  Administered 2018-01-02 – 2018-01-03 (×3): 237 mL via ORAL

## 2018-01-01 NOTE — Progress Notes (Signed)
Patient ID: Patricia Gilbert, female   DOB: [birth year], 84 y.o.   MRN: [id number] Tolerated surgery well on left hip yesterday.  Appears comfortable in be this am.  Currently stays in assisted living, but will need skilled nursing following this hospital stay.  Would not put her on any blood thinning meds due to her being a fall risk.

## 2018-01-01 NOTE — Evaluation (Addendum)
Physical Therapy Evaluation Patient Details Name: Patricia Gilbert MRN: [id number] DOB: [birth year] Today's Date: 01/01/2018   History of Present Illness   84 y.o. female with history of dementia hypothyroidism had a fall at her living facility when she slipped out of the chair. Dx of L femoral neck fx, s/p screw fixation.   Clinical Impression  Pt admitted with above diagnosis. Pt currently with functional limitations due to the deficits listed below (see PT Problem List). Total assist for supine to sit and to pivot to recliner. Pt unable to achieve full standing position when sit to stand with RW attempted x 2, she has posterior lean. She is oriented to self only, will need ST-SNF. Pt will benefit from skilled PT to increase their independence and safety with mobility to allow discharge to the venue listed below.       Follow Up Recommendations SNF;Supervision/Assistance - 24 hour;Supervision for mobility/OOB    Equipment Recommendations  None recommended by PT    Recommendations for Other Services       Precautions / Restrictions Precautions Precautions: Fall Restrictions Weight Bearing Restrictions: Yes LLE Weight Bearing: Partial weight bearing LLE Partial Weight Bearing Percentage or Pounds: 50%      Mobility  Bed Mobility Overal bed mobility: Needs Assistance Bed Mobility: Supine to Sit     Supine to sit: +2 for physical assistance;Total assist     General bed mobility comments: pt 10%, limited by dementia  Transfers Overall transfer level: Needs assistance Equipment used: Rolling walker (2 wheeled) Transfers: Sit to/from [facility] Sit to Stand: +2 physical assistance;Total assist Stand pivot transfers: Total assist       General transfer comment: sit to stand with RW x 2 trials and +2 assist, pt with flexed trunk and posterior lean, unable to achieve full upright position; SPT to recliner  Ambulation/Gait             General Gait  Details: unable  Stairs            Wheelchair Mobility    Modified Rankin (Stroke Patients Only)       Balance Overall balance assessment: Needs assistance Sitting-balance support: Feet supported Sitting balance-Leahy Scale: Fair     Standing balance support: Bilateral upper extremity supported Standing balance-Leahy Scale: Zero Standing balance comment: posterior lean                             Pertinent Vitals/Pain Pain Assessment: Faces Faces Pain Scale: Hurts even more Pain Location: L hip with movement Pain Descriptors / Indicators: Grimacing;Guarding Pain Intervention(s): Limited activity within patient's tolerance;Monitored during session;Ice applied(RN attempted to premedicate with tylenol but pt refused)    Home Living Family/patient expects to be discharged to:: Skilled nursing facility                 Additional Comments: pt has dementia, unable to provide hx; per chart she is from ALF    Prior Function           Comments: unknown     Hand Dominance        Extremity/Trunk Assessment   Upper Extremity Assessment Upper Extremity Assessment: Overall WFL for tasks assessed    Lower Extremity Assessment Lower Extremity Assessment: LLE deficits/detail RLE Sensation: WNL LLE Deficits / Details: tolerated L hip AAROM to 45* flexion and 15* abduction LLE Sensation: WNL       Communication   Communication: No difficulties  Cognition Arousal/Alertness: Awake/alert Behavior During Therapy: WFL for tasks assessed/performed Overall Cognitive Status: No family/caregiver present to determine baseline cognitive functioning                                 General Comments: pt not oriented to location nor situation, oriented to self only, repeats same questions      General Comments      Exercises  Heel Slides x 10 L supine AAROM  Hip ABDUction x 10 L supine AAROM   Assessment/Plan    PT Assessment Patient  needs continued PT services  PT Problem List Decreased mobility;Decreased activity tolerance;Decreased balance;Decreased cognition;Pain       PT Treatment Interventions DME instruction;Gait training;Functional mobility training;Therapeutic activities;Therapeutic exercise;Balance training;Patient/family education    PT Goals (Current goals can be found in the Care Plan section)  Acute Rehab PT Goals PT Goal Formulation: Patient unable to participate in goal setting Time For Goal Achievement: 01/15/18 Potential to Achieve Goals: Fair    Frequency Min 3X/week   Barriers to discharge        Co-evaluation               AM-PAC PT "6 Clicks" Daily Activity  Outcome Measure Difficulty turning over in bed (including adjusting bedclothes, sheets and blankets)?: Unable Difficulty moving from lying on back to sitting on the side of the bed? : Unable Difficulty sitting down on and standing up from a chair with arms (e.g., wheelchair, bedside commode, etc,.)?: Unable Help needed moving to and from a bed to chair (including a wheelchair)?: Total Help needed walking in hospital room?: Total Help needed climbing 3-5 steps with a railing? : Total 6 Click Score: 6    End of Session Equipment Utilized During Treatment: Gait belt Activity Tolerance: Patient limited by pain Patient left: in chair;with call bell/phone within reach;with chair alarm set Nurse Communication: Mobility status PT Visit Diagnosis: Unsteadiness on feet (R26.81);History of falling (Z91.81);Pain Pain - Right/Left: Left Pain - part of body: Hip    Time: 9604-5409 PT Time Calculation (min) (ACUTE ONLY): 20 min   Charges:   PT Evaluation $PT Eval Moderate Complexity: 1 Mod     PT G Codes:          Philomena Doheny 01/01/2018, 2:32 PM [phone]

## 2018-01-01 NOTE — Progress Notes (Signed)
Initial Nutrition Assessment  INTERVENTION:   Provide Ensure Enlive po BID, each supplement provides 350 kcal and 20 grams of protein  NUTRITION DIAGNOSIS:   Increased nutrient needs related to post-op healing as evidenced by estimated needs.  GOAL:   Patient will meet greater than or equal to 90% of their needs  MONITOR:   PO intake, Supplement acceptance, Labs, Weight trends, I & O's  REASON FOR ASSESSMENT:   Consult Hip fracture protocol  ASSESSMENT:   84 year old female with past medical history of significant history of  advanced dementia, hypothyroidism who was brought from group home after she fell.She was found to have left hip fracture.  Orthopedics is following and she underwent cannulated screw fixation of left nondisplaced femoral neck fracture  Patient with dementia and unable to obtain nutrition history. Pt currently consuming 35-50% of meals (which contain a lot of food). Pt is s/p Cannulated screw fixation of left nondisplaced femoral neck fracture on 7/6. Will order Ensure supplements for increased needs.  Pt with no weight for this admission. Uncertain of weight loss.   Labs reviewed. Medications: Colace capsule BID  NUTRITION - FOCUSED PHYSICAL EXAM:    Most Recent Value  Orbital Region  No depletion  Upper Arm Region  Mild depletion  Thoracic and Lumbar Region  Unable to assess  Buccal Region  No depletion  Temple Region  Mild depletion  Clavicle Bone Region  No depletion  Clavicle and Acromion Bone Region  No depletion  Scapular Bone Region  Unable to assess  Dorsal Hand  Unable to assess  Patellar Region  Unable to assess  Anterior Thigh Region  Unable to assess  Posterior Calf Region  Unable to assess  Edema (RD Assessment)  None       Diet Order:   Diet Order           Diet regular Room service appropriate? Yes; Fluid consistency: Thin  Diet effective now          EDUCATION NEEDS:   Not appropriate for education at this  time  Skin:  Skin Assessment: Reviewed RN Assessment  Last BM:  PTA  Height:   Ht Readings from Last 1 Encounters:  03/12/15 5\' 4"  (1.626 m)    Weight:   Wt Readings from Last 1 Encounters:  03/12/15 127 lb 12 oz (57.9 kg)    Ideal Body Weight:  54.5 kg  BMI:  Unable to determine.  Estimated Nutritional Needs:   Kcal:  1400-1600  Protein:  65-75g  Fluid:  1.5L/day  Clayton Bibles, MS, RD, LDN [place] Dietitian Pager: [phone] After Hours Pager: [phone]

## 2018-01-01 NOTE — Discharge Instructions (Signed)
Can get current left hip dressing wet in the shower daily. If this current dressing needs changing, can change to dry dressing as needed. 50% weight only left hip.

## 2018-01-01 NOTE — Progress Notes (Signed)
PROGRESS NOTE    Patricia Gilbert  VFI:[id number] DOB: Feb 09, 1926 DOA: 12/30/2017 PCP: Patient, No Pcp Per   Brief Narrative:  Patient is a 84 year old female with past medical history of significant history of  advanced dementia, hypothyroidism who was brought from group home after she fell.She was found to have left hip fracture.  Orthopedics is following and she underwent cannulated screw fixation of left nondisplaced femoral neck fracture She will be consulted by PT/OT and possibly discharge to skilled nursing facility.  Assessment & Plan:   Principal Problem:   Left displaced femoral neck fracture (HCC) Active Problems:   Hypothyroid   Mild dementia   Hip fracture (HCC)  Left displaced femoral neck fracture:S/P Cannulated screw fixation of left nondisplaced femoral neck fracture.  Lovenox for DVT prophylaxis.  Continue pain management.  Awaiting PT/OT evaluation.  Likely she will be discharged to skilled nursing facility.  Dementia: Advanced.  Continue supportive care.  Hypothyroidism: Continue Synthyroid   DVT prophylaxis: Lovenox Code Status: DNR Family Communication: None present at the bedside  Disposition Plan: likely to skilled nursing facility    Consultants: Orthopedics  Procedures:Cannulated screw fixation of left nondisplaced femoral neck fracture  Antimicrobials:None  Subjective: Patient seen and examined the bedside this morning.  Remains comfortable.   Didnot not complain of significant pain this morning.  Very confused and demented  Objective: Vitals:   12/31/17 1607 12/31/17 1700 12/31/17 2210 01/01/18 0626  BP: (!) 171/61 (!) 182/77 (!) 173/63 (!) 163/54  Pulse: 99 100 90 71  Resp: 14 16 15    Temp: 98.1 F (36.7 C) 98.2 F (36.8 C) 99.3 F (37.4 C) 97.9 F (36.6 C)  TempSrc: Oral Oral Oral Oral  SpO2: 91% 91% 91% 94%    Intake/Output Summary (Last 24 hours) at 01/01/2018 1057 Last data filed at 01/01/2018 1056 Gross per 24 hour  Intake  1060 ml  Output 710 ml  Net 350 ml   There were no vitals filed for this visit.  Examination:  General exam: Appears calm and comfortable ,Not in distress,elderly female HEENT:PERRL,Oral mucosa moist, Ear/Nose normal on gross exam Respiratory system: Bilateral equal air entry, normal vesicular breath sounds, no wheezes or crackles  Cardiovascular system: S1 & S2 heard, RRR. No JVD, murmurs, rubs, gallops or clicks. No pedal edema. Gastrointestinal system: Abdomen is nondistended, soft and nontender. No organomegaly or masses felt. Normal bowel sounds heard. Central nervous system: Alert and but not oriented. No focal neurological deficits. Extremities: No edema, no clubbing ,no cyanosis, distal peripheral pulses palpable.  Clean surgical scar on the left hip.  Left hip tenderness Skin: No rashes, lesions or ulcers,no icterus ,no pallor    Data Reviewed: I have personally reviewed following labs and imaging studies  CBC: Recent Labs  Lab 12/30/17 2201 12/31/17 0337  WBC 12.0* 9.8  NEUTROABS 10.5* 8.7*  HGB 13.1 12.9  HCT 40.0 38.6  MCV 92.0 90.8  PLT 310 416   Basic Metabolic Panel: Recent Labs  Lab 12/30/17 2201 12/31/17 0337  NA 138 137  K 3.7 3.8  CL 105 105  CO2 25 24  GLUCOSE 127* 137*  BUN 12 10  CREATININE 0.59 0.47  CALCIUM 8.8* 8.7*   GFR: CrCl cannot be calculated (Unknown ideal weight.). Liver Function Tests: Recent Labs  Lab 12/31/17 0337  AST 18  ALT 13  ALKPHOS 75  BILITOT 0.9  PROT 6.6  ALBUMIN 3.5   No results for input(s): LIPASE, AMYLASE in the last 168 hours. No  results for input(s): AMMONIA in the last 168 hours. Coagulation Profile: No results for input(s): INR, PROTIME in the last 168 hours. Cardiac Enzymes: No results for input(s): CKTOTAL, CKMB, CKMBINDEX, TROPONINI in the last 168 hours. BNP (last 3 results) No results for input(s): PROBNP in the last 8760 hours. HbA1C: No results for input(s): HGBA1C in the last 72  hours. CBG: Recent Labs  Lab 12/31/17 0905  GLUCAP 102*   Lipid Profile: No results for input(s): CHOL, HDL, LDLCALC, TRIG, CHOLHDL, LDLDIRECT in the last 72 hours. Thyroid Function Tests: Recent Labs    12/31/17 0337  TSH 4.814*   Anemia Panel: No results for input(s): VITAMINB12, FOLATE, FERRITIN, TIBC, IRON, RETICCTPCT in the last 72 hours. Sepsis Labs: No results for input(s): PROCALCITON, LATICACIDVEN in the last 168 hours.  No results found for this or any previous visit (from the past 240 hour(s)).       Radiology Studies: Dg Chest 2 View  Result Date: 12/30/2017 CLINICAL DATA:  Fall, LEFT hip pain. EXAM: CHEST - 2 VIEW COMPARISON:  None. FINDINGS: Mild cardiomegaly. No confluent opacity to suggest a developing pneumonia. No pleural effusion or pneumothorax seen. Probable atelectasis or confluent scarring at the LEFT costophrenic angle. Multiple RIGHT-sided rib fractures, of uncertain age, most likely chronic. Multiple compression fracture deformities within the mid and lower thoracic spine, of uncertain chronicity. IMPRESSION: 1. No active cardiopulmonary disease. No evidence of pneumonia or pulmonary edema. 2. Mild cardiomegaly. 3. Multiple RIGHT-sided rib fractures, of uncertain age, favor chronic. 4. Multiple compression fracture deformities within the mid and lower thoracic spine, of uncertain age but also most likely chronic. If any midline back pain, would consider CT for further characterization. Electronically Signed   By: Franki Cabot M.D.   On: 12/30/2017 20:55   Ct Head Wo Contrast  Result Date: 12/30/2017 CLINICAL DATA:  Patient slipped out of chair and fell on the floor. EXAM: CT HEAD WITHOUT CONTRAST CT CERVICAL SPINE WITHOUT CONTRAST TECHNIQUE: Multidetector CT imaging of the head and cervical spine was performed following the standard protocol without intravenous contrast. Multiplanar CT image reconstructions of the cervical spine were also generated.  COMPARISON:  None. FINDINGS: CT HEAD FINDINGS Brain: There is no evidence for acute hemorrhage, hydrocephalus, mass lesion, or abnormal extra-axial fluid collection. No definite CT evidence for acute infarction. Diffuse loss of parenchymal volume is consistent with atrophy. Patchy low attenuation in the deep hemispheric and periventricular white matter is nonspecific, but likely reflects chronic microvascular ischemic demyelination. Vascular: No hyperdense vessel or unexpected calcification. Skull: Normal. Negative for fracture or focal lesion. Sinuses/Orbits: No acute finding. Other: None. CT CERVICAL SPINE FINDINGS Alignment: Normal. Skull base and vertebrae: No acute fracture. No primary bone lesion or focal pathologic process. Soft tissues and spinal canal: No prevertebral fluid or swelling. No visible canal hematoma. Disc levels:  Loss of disc height noted at C2-3 in C5-6. Upper chest: Biapical pleuroparenchymal scarring. Other: None. IMPRESSION: 1. No acute intracranial abnormality. Atrophy with chronic small vessel white matter ischemic disease. 2. No cervical spine fracture. Degenerative changes at C2-3 and C5-6. Electronically Signed   By: Misty Stanley M.D.   On: 12/30/2017 21:11   Ct Chest W Contrast  Result Date: 12/31/2017 CLINICAL DATA:  [place] out of chair, possible rib fracture pain to the left hip EXAM: CT CHEST, ABDOMEN, AND PELVIS WITH CONTRAST TECHNIQUE: Multidetector CT imaging of the chest, abdomen and pelvis was performed following the standard protocol during bolus administration of intravenous contrast. CONTRAST:  121mL ISOVUE-300 IOPAMIDOL (ISOVUE-300) INJECTION 61% COMPARISON:  Radiograph 12/30/2017 FINDINGS: CT CHEST FINDINGS Cardiovascular: Nonaneurysmal aorta. Moderate aortic atherosclerosis. Normal heart size. Small pericardial effusion. Mediastinum/Nodes: Negative for hematoma. Midline trachea. Subcentimeter nodules in the right lobe. No significant adenopathy. Esophagus within  normal limits Lungs/Pleura: Calcified nodule in the right lower lobe. No acute consolidation or effusion. Apical scarring on the right. Atelectasis at the left base. No pneumothorax Musculoskeletal: Sternum is intact. Old right fourth, fifth, sixth, seventh, eighth, ninth and tenth posterior rib fractures. Age indeterminate moderate compression fractures at T4, T5, T8, T10, T11 and T12. CT ABDOMEN PELVIS FINDINGS Hepatobiliary: No focal liver abnormality is seen. No gallstones, gallbladder wall thickening, or biliary dilatation. Pancreas: Unremarkable. No pancreatic ductal dilatation or surrounding inflammatory changes. Spleen: Calcified granuloma Adrenals/Urinary Tract: Adrenal glands are unremarkable. Kidneys are normal, without renal calculi, focal lesion, or hydronephrosis. Bladder is unremarkable. Stomach/Bowel: Stomach is within normal limits. Appendix appears normal. No evidence of bowel wall thickening, distention, or inflammatory changes. Sigmoid colon diverticular disease without acute inflammation Vascular/Lymphatic: Mild aortic atherosclerosis. No aneurysm. No significantly enlarged lymph nodes Reproductive: Calcified fibroids.  No adnexal mass Other: No free air or free fluid Musculoskeletal: Acute left femoral neck fracture. Age indeterminate mild inferior endplate deformity at L3. Mild to moderate age indeterminate compression fracture at L5. IMPRESSION: 1. No CT evidence for acute mediastinal injury. No CT evidence for acute solid organ injury within the abdomen or pelvis. No free air or free fluid. 2. Multiple age indeterminate moderate compression fractures T4-T5, T8, T10 through T12, and L3 and L5. No retropulsion. 3. Acute left femoral neck fracture 4. Sigmoid colon diverticular disease without acute inflammation Electronically Signed   By: Donavan Foil M.D.   On: 12/31/2017 00:04   Ct Cervical Spine Wo Contrast  Result Date: 12/30/2017 CLINICAL DATA:  Patient slipped out of chair and fell on  the floor. EXAM: CT HEAD WITHOUT CONTRAST CT CERVICAL SPINE WITHOUT CONTRAST TECHNIQUE: Multidetector CT imaging of the head and cervical spine was performed following the standard protocol without intravenous contrast. Multiplanar CT image reconstructions of the cervical spine were also generated. COMPARISON:  None. FINDINGS: CT HEAD FINDINGS Brain: There is no evidence for acute hemorrhage, hydrocephalus, mass lesion, or abnormal extra-axial fluid collection. No definite CT evidence for acute infarction. Diffuse loss of parenchymal volume is consistent with atrophy. Patchy low attenuation in the deep hemispheric and periventricular white matter is nonspecific, but likely reflects chronic microvascular ischemic demyelination. Vascular: No hyperdense vessel or unexpected calcification. Skull: Normal. Negative for fracture or focal lesion. Sinuses/Orbits: No acute finding. Other: None. CT CERVICAL SPINE FINDINGS Alignment: Normal. Skull base and vertebrae: No acute fracture. No primary bone lesion or focal pathologic process. Soft tissues and spinal canal: No prevertebral fluid or swelling. No visible canal hematoma. Disc levels:  Loss of disc height noted at C2-3 in C5-6. Upper chest: Biapical pleuroparenchymal scarring. Other: None. IMPRESSION: 1. No acute intracranial abnormality. Atrophy with chronic small vessel white matter ischemic disease. 2. No cervical spine fracture. Degenerative changes at C2-3 and C5-6. Electronically Signed   By: Misty Stanley M.D.   On: 12/30/2017 21:11   Ct Abdomen Pelvis W Contrast  Result Date: 12/31/2017 CLINICAL DATA:  [place] out of chair, possible rib fracture pain to the left hip EXAM: CT CHEST, ABDOMEN, AND PELVIS WITH CONTRAST TECHNIQUE: Multidetector CT imaging of the chest, abdomen and pelvis was performed following the standard protocol during bolus administration of intravenous contrast. CONTRAST:  141mL ISOVUE-300 IOPAMIDOL (  ISOVUE-300) INJECTION 61% COMPARISON:   Radiograph 12/30/2017 FINDINGS: CT CHEST FINDINGS Cardiovascular: Nonaneurysmal aorta. Moderate aortic atherosclerosis. Normal heart size. Small pericardial effusion. Mediastinum/Nodes: Negative for hematoma. Midline trachea. Subcentimeter nodules in the right lobe. No significant adenopathy. Esophagus within normal limits Lungs/Pleura: Calcified nodule in the right lower lobe. No acute consolidation or effusion. Apical scarring on the right. Atelectasis at the left base. No pneumothorax Musculoskeletal: Sternum is intact. Old right fourth, fifth, sixth, seventh, eighth, ninth and tenth posterior rib fractures. Age indeterminate moderate compression fractures at T4, T5, T8, T10, T11 and T12. CT ABDOMEN PELVIS FINDINGS Hepatobiliary: No focal liver abnormality is seen. No gallstones, gallbladder wall thickening, or biliary dilatation. Pancreas: Unremarkable. No pancreatic ductal dilatation or surrounding inflammatory changes. Spleen: Calcified granuloma Adrenals/Urinary Tract: Adrenal glands are unremarkable. Kidneys are normal, without renal calculi, focal lesion, or hydronephrosis. Bladder is unremarkable. Stomach/Bowel: Stomach is within normal limits. Appendix appears normal. No evidence of bowel wall thickening, distention, or inflammatory changes. Sigmoid colon diverticular disease without acute inflammation Vascular/Lymphatic: Mild aortic atherosclerosis. No aneurysm. No significantly enlarged lymph nodes Reproductive: Calcified fibroids.  No adnexal mass Other: No free air or free fluid Musculoskeletal: Acute left femoral neck fracture. Age indeterminate mild inferior endplate deformity at L3. Mild to moderate age indeterminate compression fracture at L5. IMPRESSION: 1. No CT evidence for acute mediastinal injury. No CT evidence for acute solid organ injury within the abdomen or pelvis. No free air or free fluid. 2. Multiple age indeterminate moderate compression fractures T4-T5, T8, T10 through T12, and L3  and L5. No retropulsion. 3. Acute left femoral neck fracture 4. Sigmoid colon diverticular disease without acute inflammation Electronically Signed   By: Donavan Foil M.D.   On: 12/31/2017 00:04   Dg Finger Thumb Left  Result Date: 12/31/2017 CLINICAL DATA:  Fall with thumb pain EXAM: LEFT THUMB 2+V COMPARISON:  None. FINDINGS: Acute nondisplaced intra-articular fracture involving the dorsal base of the first distal phalanx. No subluxation. Mild degenerative change at the PIP joint IMPRESSION: Acute nondisplaced intra-articular fracture involving the base of the first distal phalanx Electronically Signed   By: Donavan Foil M.D.   On: 12/31/2017 00:47   Dg C-arm 1-60 Min-no Report  Result Date: 12/31/2017 Fluoroscopy was utilized by the requesting physician.  No radiographic interpretation.   Dg Hip Operative Unilat W Or W/o Pelvis Left  Result Date: 12/31/2017 CLINICAL DATA:  Left hip repair EXAM: OPERATIVE LEFT HIP (WITH PELVIS IF PERFORMED) 6 VIEWS TECHNIQUE: Fluoroscopic spot image(s) were submitted for interpretation post-operatively. COMPARISON:  None. FINDINGS: Three screws were placed across the proximal femoral fracture throughout the study. IMPRESSION: Fracture repair as above. Electronically Signed   By: Dorise Bullion III M.D   On: 12/31/2017 12:07   Dg Hip Unilat With Pelvis 2-3 Views Left  Result Date: 12/30/2017 CLINICAL DATA:  Fall, LEFT hip pain. EXAM: DG HIP (WITH OR WITHOUT PELVIS) 2-3V LEFT COMPARISON:  None. FINDINGS: There is a slightly displaced fracture of the LEFT femoral neck, subcapital, with suspected impaction at the fracture site. Femoral head remains appropriately positioned relative to the acetabulum. No additional fracture identified within the osseous pelvis or about the RIGHT hip. Osteopenia limits characterization of osseous detail. Soft tissues about the LEFT hip are unremarkable. IMPRESSION: Slightly displaced fracture of the LEFT femoral neck, subcapital region.  Electronically Signed   By: Franki Cabot M.D.   On: 12/30/2017 20:51        Scheduled Meds: . docusate sodium  100 mg  Oral BID  . enoxaparin (LOVENOX) injection  30 mg Subcutaneous Q24H  . lidocaine  1 patch Transdermal Daily  . nystatin  1 g Topical BID   Continuous Infusions:   LOS: 1 day    Time spent:25 mins. More than 50% of that time was spent in counseling and/or coordination of care.      Shelly Coss, MD [facility] Pager [phone]  If 7PM-7AM, please contact night-coverage [email or web address] Password [facility] 01/01/2018, 10:57 AM

## 2018-01-02 ENCOUNTER — Encounter (HOSPITAL_COMMUNITY): Payer: Self-pay | Admitting: Orthopaedic Surgery

## 2018-01-02 DIAGNOSIS — I10 Essential (primary) hypertension: Secondary | ICD-10-CM

## 2018-01-02 LAB — CBC WITH DIFFERENTIAL/PLATELET
BASOS ABS: 0 10*3/uL (ref 0.0–0.1)
BASOS PCT: 0 %
Eosinophils Absolute: 0.1 10*3/uL (ref 0.0–0.7)
Eosinophils Relative: 1 %
HEMATOCRIT: 37.4 % (ref 36.0–46.0)
HEMOGLOBIN: 12.3 g/dL (ref 12.0–15.0)
LYMPHS PCT: 16 %
Lymphs Abs: 1.3 10*3/uL (ref 0.7–4.0)
MCH: 30.2 pg (ref 26.0–34.0)
MCHC: 32.9 g/dL (ref 30.0–36.0)
MCV: 91.9 fL (ref 78.0–100.0)
Monocytes Absolute: 0.8 10*3/uL (ref 0.1–1.0)
Monocytes Relative: 10 %
NEUTROS ABS: 5.7 10*3/uL (ref 1.7–7.7)
NEUTROS PCT: 73 %
Platelets: 275 10*3/uL (ref 150–400)
RBC: 4.07 MIL/uL (ref 3.87–5.11)
RDW: 14.6 % (ref 11.5–15.5)
WBC: 7.9 10*3/uL (ref 4.0–10.5)

## 2018-01-02 LAB — GLUCOSE, CAPILLARY: GLUCOSE-CAPILLARY: 122 mg/dL — AB (ref 70–99)

## 2018-01-02 MED ORDER — AMLODIPINE BESYLATE 10 MG PO TABS
10.0000 mg | ORAL_TABLET | Freq: Every day | ORAL | Status: DC
  Administered 2018-01-02 – 2018-01-03 (×2): 10 mg via ORAL
  Filled 2018-01-02 (×2): qty 1

## 2018-01-02 NOTE — NC FL2 (Signed)
[place] LEVEL OF CARE SCREENING TOOL     IDENTIFICATION  Patient Name: Patricia Gilbert Birthdate: December 16, 1925 Sex: female Admission Date (Current Location): 12/30/2017  [facility] and [place] Number:  Herbalist and Address:  [facility],  [place] [address] [place]      Provider Number: [id number]  Attending Physician Name and Address:  Shelly Coss, MD  Relative Name and Phone Number:       Current Level of Care: Hospital Recommended Level of Care: [place] Prior Approval Number:    Date Approved/Denied: 01/02/18 PASRR Number: [id number]  Discharge Plan: SNF    Current Diagnoses: Patient Active Problem List   Diagnosis Date Noted  . Hip fracture ([place]) 12/31/2017  . Left displaced femoral neck fracture ([place]) 12/31/2017  . Wound of left leg 03/13/2015  . Osteoporosis, senile 01/09/2013  . Allergic rhinitis, cause unspecified   . Hypothyroid   . Mild dementia   . Syncope 08/20/2011  . Sinus tachycardia 08/20/2011    Orientation RESPIRATION BLADDER Height & Weight     Self, Place  Normal External catheter Weight: 129 lb 10.1 oz (58.8 kg) Height:  5\' 2"  (157.5 cm)  BEHAVIORAL SYMPTOMS/MOOD NEUROLOGICAL BOWEL NUTRITION STATUS      Continent Diet(See dc summary)  AMBULATORY STATUS COMMUNICATION OF NEEDS Skin   Extensive Assist Verbally Surgical wounds(left hip)                       Personal Care Assistance Level of Assistance  Bathing, Feeding, Dressing Bathing Assistance: Limited assistance Feeding assistance: Independent Dressing Assistance: Limited assistance     Functional Limitations Info  Sight, Hearing, Speech Sight Info: Adequate Hearing Info: Adequate Speech Info: Adequate    SPECIAL CARE FACTORS FREQUENCY  PT (By licensed PT), OT (By licensed OT)     PT Frequency: 5x/week OT Frequency: 5x/week            Contractures Contractures Info: Not present    Additional  Factors Info  Code Status, Allergies Code Status Info: DNR Allergies Info: Aspirin           Current Medications (01/02/2018):  This is the current hospital active medication list Current Facility-Administered Medications  Medication Dose Route Frequency Provider Last Rate Last Dose  . acetaminophen (TYLENOL) tablet 325-650 mg  325-650 mg Oral Q6H PRN Mcarthur Rossetti, MD      . amLODipine (NORVASC) tablet 10 mg  10 mg Oral Daily Shelly Coss, MD   10 mg at 01/02/18 0956  . docusate sodium (COLACE) capsule 100 mg  100 mg Oral BID Mcarthur Rossetti, MD   100 mg at 01/02/18 0956  . enoxaparin (LOVENOX) injection 30 mg  30 mg Subcutaneous Q24H Shelly Coss, MD   30 mg at 01/01/18 1305  . feeding supplement (ENSURE ENLIVE) (ENSURE ENLIVE) liquid 237 mL  237 mL Oral BID BM Shelly Coss, MD   237 mL at 01/02/18 0955  . HYDROcodone-acetaminophen (NORCO) 7.5-325 MG per tablet 1-2 tablet  1-2 tablet Oral Q4H PRN Mcarthur Rossetti, MD      . HYDROcodone-acetaminophen (NORCO/VICODIN) 5-325 MG per tablet 1-2 tablet  1-2 tablet Oral Q4H PRN Mcarthur Rossetti, MD      . lidocaine (LIDODERM) 5 % 1 patch  1 patch Transdermal Daily Mcarthur Rossetti, MD   1 patch at 01/02/18 [phone]  . menthol-cetylpyridinium (CEPACOL) lozenge 3 mg  1 lozenge Oral PRN Mcarthur Rossetti,  MD       Or  . phenol (CHLORASEPTIC) mouth spray 1 spray  1 spray Mouth/Throat PRN Mcarthur Rossetti, MD      . metoCLOPramide (REGLAN) tablet 5-10 mg  5-10 mg Oral Q8H PRN Mcarthur Rossetti, MD       Or  . metoCLOPramide (REGLAN) injection 5-10 mg  5-10 mg Intravenous Q8H PRN Mcarthur Rossetti, MD      . morphine 2 MG/ML injection 0.5-1 mg  0.5-1 mg Intravenous Q2H PRN Mcarthur Rossetti, MD      . nystatin (MYCOSTATIN/NYSTOP) topical powder 1 g  1 g Topical BID Mcarthur Rossetti, MD   1 g at 01/02/18 1000  . ondansetron (ZOFRAN) tablet 4 mg  4 mg Oral Q6H PRN Mcarthur Rossetti, MD       Or  . ondansetron [facility]) injection 4 mg  4 mg Intravenous Q6H PRN Mcarthur Rossetti, MD         Discharge Medications: Please see discharge summary for a list of discharge medications.  Relevant Imaging Results:  Relevant Lab Results:   Additional Information ssn: [id number]  Servando Snare, LCSW

## 2018-01-02 NOTE — Progress Notes (Signed)
OT Cancellation Note  Patient Details Name: Patricia Gilbert MRN: [id number] DOB: [birth year]   Cancelled Treatment:    Reason Eval/Treat Not Completed: Other (comment)  Noted pt requires total +2 assist for all mobility and is limited by dementia. Will defer OT to next venue   [place] 01/02/2018, 7:29 AM  Lesle Chris, OTR/L [phone] 01/02/2018

## 2018-01-02 NOTE — Progress Notes (Signed)
Patient ID: Patricia Gilbert, female   DOB: 12-28-1925, 83 y.o.   MRN: [id number] No acute changes from ortho standpoint.  Needs skilled nursing placement.  Tylenol only for pain.  Left hip stable.

## 2018-01-02 NOTE — Clinical Social Work Note (Signed)
Clinical Social Work Assessment  Patient Details  Name: Patricia Gilbert MRN: [id number] Date of Birth: 1926/01/27  Date of referral:  01/02/18               Reason for consult:  Facility Placement                Permission sought to share information with:  Case Manager, Customer service manager, Family Supports Permission granted to share information::  No(Patient oriented to person)  Name::     Software engineer::  [employer]  Relationship::  Son  Sport and exercise psychologist Information:     Housing/Transportation Living arrangements for the past 2 months:  [place] of Information:  Adult Children, Facility Patient Interpreter Needed:  None Criminal Activity/Legal Involvement Pertinent to Current Situation/Hospitalization:  No - Comment as needed Significant Relationships:  None Lives with:  Facility Resident Do you feel safe going back to the place where you live?  Yes Need for family participation in patient care:  Yes (Comment)  Care giving concerns:  No care giving concerns at the time of assessment.    Social Worker assessment / plan:  LCSW consulted for SNF placement.   Patient is a resident at [facility]. PT is recommending SNF at dc. Patient will dc to SNF at [facility].   LCSW spoke with facility and patient's son Patricia Gilbert. Facility is aware of patient's needs and will have a bed for her at dc.   Son Patricia Gilbert states that patient is in a wheel chair most of the time, but can walk with a walker. Patient requires assistance with ADLs. According to facility, patient is disoriented at baseline.    PLAN: SNF at dc.    Employment status:  Retired Forensic scientist:  Medicare PT Recommendations:  [place] / Referral to community resources:     Patient/Family's Response to care:  Son is thankful for LCSW coordination with dc planning.   Patient/Family's Understanding of and Emotional Response to Diagnosis, Current  Treatment, and Prognosis:  Family is realistic of patient's diagnosis and treatment plan. Family is anticipating patients dc to SNF. Son, Patricia Gilbert asked to be updated when patient is ready for dc.   Emotional Assessment Appearance:  Appears stated age Attitude/Demeanor/Rapport:  Unable to Assess Affect (typically observed):  Calm Orientation:  Oriented to Place, Oriented to Self Alcohol / Substance use:  Not Applicable Psych involvement (Current and /or in the community):  No (Comment)  Discharge Needs  Concerns to be addressed:  No discharge needs identified Readmission within the last 30 days:  No Current discharge risk:  None Barriers to Discharge:  Continued Medical Work up   [employer], LCSW 01/02/2018, 10:28 AM

## 2018-01-02 NOTE — Progress Notes (Signed)
PROGRESS NOTE    Patricia Gilbert  QIW:[id number] DOB: Apr 03, 1926 DOA: 12/30/2017 PCP: Patient, No Pcp Per   Brief Narrative:  Patient is a 84 year old female with past medical history of significant history of  advanced dementia, hypothyroidism who was brought from group home after she fell.She was found to have left hip fracture.  Orthopedics is following and she underwent cannulated screw fixation of left nondisplaced femoral neck fracture She has been seen by PT/OT and will be discharged to skilled nursing facility.  Assessment & Plan:   Principal Problem:   Left displaced femoral neck fracture (HCC) Active Problems:   Hypothyroid   Mild dementia   Hip fracture (HCC)   HTN (hypertension)  Left displaced femoral neck fracture:S/P Cannulated screw fixation of left nondisplaced femoral neck fracture.  Lovenox for DVT prophylaxis for  now.  She will be discharged to skilled nursing facility tomorrow. Orthopedics does not suggest any blood thinners on discharge because of weeks of fall .Recommending Tylenol only for pain medication.  Dementia: Advanced.  Continue supportive care.  Hypothyroidism: Continue Synthyroid  HTN: Blood pressure noted to be consistently on the higher side.  Not on any medications at home.  Start on amlodipine 10 mg daily.  DVT prophylaxis: Lovenox Code Status: DNR Family Communication: None present at the bedside  Disposition Plan: Skilled nursing facility tomorrow    Consultants: Orthopedics  Procedures:Cannulated screw fixation of left nondisplaced femoral neck fracture  Antimicrobials:None  Subjective: Patient seen and examined the bedside this morning.  Remains comfortable.   Didnot not complain of significant pain this morning.  Very confused and demented  Objective: Vitals:   01/01/18 1158 01/01/18 1313 01/01/18 2209 01/02/18 0617  BP:  (!) 143/74 (!) 184/63 (!) 180/71  Pulse:  99 86 90  Resp:  16 16 16   Temp:   98 F (36.7 C) 98.1 F  (36.7 C)  TempSrc:   Oral Oral  SpO2:  94% 95% 94%  Weight: 58.8 kg (129 lb 10.1 oz)     Height: 5\' 2"  (1.575 m)       Intake/Output Summary (Last 24 hours) at 01/02/2018 1047 Last data filed at 01/02/2018 4174 Gross per 24 hour  Intake 840 ml  Output 125 ml  Net 715 ml   Filed Weights   01/01/18 1158  Weight: 58.8 kg (129 lb 10.1 oz)    Examination:  General exam: Appears calm and comfortable ,Not in distress,elderly female HEENT:PERRL,Oral mucosa moist, Ear/Nose normal on gross exam Respiratory system: Bilateral equal air entry, normal vesicular breath sounds, no wheezes or crackles  Cardiovascular system: S1 & S2 heard, RRR. No JVD, murmurs, rubs, gallops or clicks. No pedal edema. Gastrointestinal system: Abdomen is nondistended, soft and nontender. No organomegaly or masses felt. Normal bowel sounds heard. Central nervous system: Alert and but not oriented. No focal neurological deficits. Extremities: No edema, no clubbing ,no cyanosis, distal peripheral pulses palpable.  Clean surgical scar on the left hip.  Left hip tenderness Skin: No rashes, lesions or ulcers,no icterus ,no pallor    Data Reviewed: I have personally reviewed following labs and imaging studies  CBC: Recent Labs  Lab 12/30/17 2201 12/31/17 0337  WBC 12.0* 9.8  NEUTROABS 10.5* 8.7*  HGB 13.1 12.9  HCT 40.0 38.6  MCV 92.0 90.8  PLT 310 081   Basic Metabolic Panel: Recent Labs  Lab 12/30/17 2201 12/31/17 0337  NA 138 137  K 3.7 3.8  CL 105 105  CO2 25 24  GLUCOSE  127* 137*  BUN 12 10  CREATININE 0.59 0.47  CALCIUM 8.8* 8.7*   GFR: Estimated Creatinine Clearance: 37.7 mL/min (by C-G formula based on SCr of 0.47 mg/dL). Liver Function Tests: Recent Labs  Lab 12/31/17 0337  AST 18  ALT 13  ALKPHOS 75  BILITOT 0.9  PROT 6.6  ALBUMIN 3.5   No results for input(s): LIPASE, AMYLASE in the last 168 hours. No results for input(s): AMMONIA in the last 168 hours. Coagulation  Profile: No results for input(s): INR, PROTIME in the last 168 hours. Cardiac Enzymes: No results for input(s): CKTOTAL, CKMB, CKMBINDEX, TROPONINI in the last 168 hours. BNP (last 3 results) No results for input(s): PROBNP in the last 8760 hours. HbA1C: No results for input(s): HGBA1C in the last 72 hours. CBG: Recent Labs  Lab 12/31/17 0905 01/02/18 0800  GLUCAP 102* 122*   Lipid Profile: No results for input(s): CHOL, HDL, LDLCALC, TRIG, CHOLHDL, LDLDIRECT in the last 72 hours. Thyroid Function Tests: Recent Labs    12/31/17 0337  TSH 4.814*   Anemia Panel: No results for input(s): VITAMINB12, FOLATE, FERRITIN, TIBC, IRON, RETICCTPCT in the last 72 hours. Sepsis Labs: No results for input(s): PROCALCITON, LATICACIDVEN in the last 168 hours.  No results found for this or any previous visit (from the past 240 hour(s)).       Radiology Studies: Dg C-arm 1-60 Min-no Report  Result Date: 12/31/2017 Fluoroscopy was utilized by the requesting physician.  No radiographic interpretation.   Dg Hip Operative Unilat W Or W/o Pelvis Left  Result Date: 12/31/2017 CLINICAL DATA:  Left hip repair EXAM: OPERATIVE LEFT HIP (WITH PELVIS IF PERFORMED) 6 VIEWS TECHNIQUE: Fluoroscopic spot image(s) were submitted for interpretation post-operatively. COMPARISON:  None. FINDINGS: Three screws were placed across the proximal femoral fracture throughout the study. IMPRESSION: Fracture repair as above. Electronically Signed   By: Dorise Bullion III M.D   On: 12/31/2017 12:07        Scheduled Meds: . amLODipine  10 mg Oral Daily  . docusate sodium  100 mg Oral BID  . enoxaparin (LOVENOX) injection  30 mg Subcutaneous Q24H  . feeding supplement (ENSURE ENLIVE)  237 mL Oral BID BM  . lidocaine  1 patch Transdermal Daily  . nystatin  1 g Topical BID   Continuous Infusions:   LOS: 2 days    Time spent:25 mins.       Shelly Coss, MD [facility] Pager [phone]  If  7PM-7AM, please contact night-coverage [email or web address] Password [facility] 01/02/2018, 10:47 AM

## 2018-01-03 DIAGNOSIS — D72829 Elevated white blood cell count, unspecified: Secondary | ICD-10-CM | POA: Diagnosis not present

## 2018-01-03 DIAGNOSIS — Z85828 Personal history of other malignant neoplasm of skin: Secondary | ICD-10-CM | POA: Diagnosis not present

## 2018-01-03 DIAGNOSIS — K5900 Constipation, unspecified: Secondary | ICD-10-CM | POA: Diagnosis not present

## 2018-01-03 DIAGNOSIS — E039 Hypothyroidism, unspecified: Secondary | ICD-10-CM | POA: Diagnosis not present

## 2018-01-03 DIAGNOSIS — M810 Age-related osteoporosis without current pathological fracture: Secondary | ICD-10-CM | POA: Diagnosis not present

## 2018-01-03 DIAGNOSIS — Z9114 Patient's other noncompliance with medication regimen: Secondary | ICD-10-CM | POA: Diagnosis not present

## 2018-01-03 DIAGNOSIS — Z79899 Other long term (current) drug therapy: Secondary | ICD-10-CM | POA: Diagnosis not present

## 2018-01-03 DIAGNOSIS — R451 Restlessness and agitation: Secondary | ICD-10-CM | POA: Diagnosis not present

## 2018-01-03 DIAGNOSIS — F0390 Unspecified dementia without behavioral disturbance: Secondary | ICD-10-CM | POA: Diagnosis not present

## 2018-01-03 DIAGNOSIS — M25552 Pain in left hip: Secondary | ICD-10-CM | POA: Diagnosis not present

## 2018-01-03 DIAGNOSIS — Y92129 Unspecified place in nursing home as the place of occurrence of the external cause: Secondary | ICD-10-CM | POA: Diagnosis not present

## 2018-01-03 DIAGNOSIS — I10 Essential (primary) hypertension: Secondary | ICD-10-CM | POA: Diagnosis not present

## 2018-01-03 DIAGNOSIS — R41841 Cognitive communication deficit: Secondary | ICD-10-CM | POA: Diagnosis not present

## 2018-01-03 DIAGNOSIS — S72002D Fracture of unspecified part of neck of left femur, subsequent encounter for closed fracture with routine healing: Secondary | ICD-10-CM | POA: Diagnosis not present

## 2018-01-03 DIAGNOSIS — Z886 Allergy status to analgesic agent status: Secondary | ICD-10-CM | POA: Diagnosis not present

## 2018-01-03 DIAGNOSIS — J309 Allergic rhinitis, unspecified: Secondary | ICD-10-CM | POA: Diagnosis not present

## 2018-01-03 DIAGNOSIS — B372 Candidiasis of skin and nail: Secondary | ICD-10-CM | POA: Diagnosis not present

## 2018-01-03 DIAGNOSIS — M6281 Muscle weakness (generalized): Secondary | ICD-10-CM | POA: Diagnosis not present

## 2018-01-03 DIAGNOSIS — R296 Repeated falls: Secondary | ICD-10-CM | POA: Diagnosis not present

## 2018-01-03 DIAGNOSIS — W07XXXA Fall from chair, initial encounter: Secondary | ICD-10-CM | POA: Diagnosis not present

## 2018-01-03 DIAGNOSIS — Z743 Need for continuous supervision: Secondary | ICD-10-CM | POA: Diagnosis not present

## 2018-01-03 DIAGNOSIS — S62502A Fracture of unspecified phalanx of left thumb, initial encounter for closed fracture: Secondary | ICD-10-CM | POA: Diagnosis present

## 2018-01-03 DIAGNOSIS — Z79890 Hormone replacement therapy (postmenopausal): Secondary | ICD-10-CM | POA: Diagnosis not present

## 2018-01-03 DIAGNOSIS — Z7983 Long term (current) use of bisphosphonates: Secondary | ICD-10-CM | POA: Diagnosis not present

## 2018-01-03 DIAGNOSIS — S72002A Fracture of unspecified part of neck of left femur, initial encounter for closed fracture: Secondary | ICD-10-CM | POA: Diagnosis not present

## 2018-01-03 DIAGNOSIS — Z66 Do not resuscitate: Secondary | ICD-10-CM | POA: Diagnosis not present

## 2018-01-03 DIAGNOSIS — Z23 Encounter for immunization: Secondary | ICD-10-CM | POA: Diagnosis not present

## 2018-01-03 DIAGNOSIS — R279 Unspecified lack of coordination: Secondary | ICD-10-CM | POA: Diagnosis not present

## 2018-01-03 MED ORDER — ACETAMINOPHEN 325 MG PO TABS: 325.0000 mg | ORAL_TABLET | Freq: Four times a day (QID) | ORAL | 0 refills | Status: AC | PRN

## 2018-01-03 MED ORDER — AMLODIPINE BESYLATE 10 MG PO TABS: 10.0000 mg | ORAL_TABLET | Freq: Every day | ORAL | 0 refills | Status: DC

## 2018-01-03 MED ORDER — DOCUSATE SODIUM 100 MG PO CAPS: 100.0000 mg | ORAL_CAPSULE | Freq: Two times a day (BID) | ORAL | 0 refills | Status: AC

## 2018-01-03 NOTE — Progress Notes (Signed)
Report given to [place] at [facility], she expressed no questions or concerns. Prescriptions and discharge paperwork sent with patient along with valuables.

## 2018-01-03 NOTE — Progress Notes (Signed)
D/C summary sent.  Nurse given number to call report. Son-Tom notified of patient discharge [facility] arranged for transport.  Kathrin Greathouse, Marlinda Mike, MSW Clinical Social Worker  [phone] 01/03/2018  1:02 PM

## 2018-01-03 NOTE — Care Management Note (Signed)
Case Management Note  Patient Details  Name: NATASCHA EDMONDS MRN: [id number] Date of Birth: 10-29-1925  Subjective/Objective:     Left displaced femoral neck fracture               Action/Plan: discharged to SNF   Expected Discharge Date:  01/03/18               Expected Discharge Plan:  [place]  In-House Referral:  Clinical Social Work  Discharge planning Services  CM Consult  Post Acute Care Choice:    Choice offered to:     DME Arranged:    DME Agency:     HH Arranged:    [place] Agency:     Status of Service:  Completed, signed off  If discussed at [employer], dates discussed:    Additional CommentsPurcell Mouton, RN 01/03/2018, 1:38 PM

## 2018-01-03 NOTE — Care Management Important Message (Signed)
Important Message  Patient Details  Name: DANNETTE KINKAID MRN: [id number] Date of Birth: Nov 22, 1925   Medicare Important Message Given:  Yes    Kerin Salen 01/03/2018, 1:34 PMImportant Message  Patient Details  Name: MADA SADIK MRN: [id number] Date of Birth: 1926/06/28   Medicare Important Message Given:  Yes    Kerin Salen 01/03/2018, 1:29 PM

## 2018-01-03 NOTE — Discharge Summary (Signed)
Physician Discharge Summary  Patricia Gilbert HGD:[id number] DOB: 01-07-1926 DOA: 12/30/2017  PCP: Patient, No Pcp Per  Admit date: 12/30/2017 Discharge date: 01/03/2018  Admitted From: Home Disposition:  SNF  Discharge Condition:Stable CODE STATUS:DNR Diet recommendation: Heart Healthy  Brief/Interim Summary:  Patient is a 84 year old female with past medical history of significant history of  advanced dementia, hypothyroidism who was brought from group home after she fell.She was found to have left hip fracture. Orthopedics was following and she underwent cannulated screw fixation of left nondisplaced femoral neck fracture She has been seen by PT/OT and will be discharged to skilled nursing facility.  Following problems were addressed during hospitalization:Left displaced femoral neck fracture:S/P Cannulated screw fixation of left nondisplaced femoral neck fracture. She will be discharged to skilled nursing facility today. Orthopedics does not suggest any blood thinners on discharge because of weeks of fall .Recommending Tylenol only for pain medication.  Dementia: Advanced.  Continue supportive care.  Hypothyroidism: Continue Synthyroid  HTN: Blood pressure noted to be consistently on the higher side.  Not on any medications at home.  Started on amlodipine 10 mg daily.    Discharge Diagnoses:  Principal Problem:   Left displaced femoral neck fracture (HCC) Active Problems:   Hypothyroid   Mild dementia   Hip fracture (HCC)   HTN (hypertension)    Discharge Instructions  Discharge Instructions    Diet - low sodium heart healthy   Complete by:  As directed    Discharge instructions   Complete by:  As directed    1) Follow up with orthopedics as an outpatient.  Name and number of the provider has been attached. 2) Blood pressure needs to be monitored.   Increase activity slowly   Complete by:  As directed    Partial weight bearing   Complete by:  As directed    %  Body Weight:  50% weight   Laterality:  left   Extremity:  Lower     Allergies as of 01/03/2018      Reactions   Aspirin       Medication List    TAKE these medications   acetaminophen 325 MG tablet Commonly known as:  TYLENOL Take 1-2 tablets (325-650 mg total) by mouth every 6 (six) hours as needed for mild pain (pain score 1-3 or temp > 100.5).   alendronate 70 MG tablet Commonly known as:  FOSAMAX Take 1 tablet (70 mg total) by mouth every 7 (seven) days. Take with a full glass of water on an empty stomach.   amLODipine 10 MG tablet Commonly known as:  NORVASC Take 1 tablet (10 mg total) by mouth daily. Start taking on:  01/04/2018   docusate sodium 100 MG capsule Commonly known as:  COLACE Take 1 capsule (100 mg total) by mouth 2 (two) times daily.   levothyroxine 50 MCG tablet Commonly known as:  SYNTHROID, LEVOTHROID Take 1 tablet (50 mcg total) by mouth daily.   lidocaine 5 % Commonly known as:  LIDODERM Place 1 patch onto the skin. To lower back daily in the am and remove at bedtime as needed   nystatin powder Generic drug:  nystatin Apply 1 g topically 2 (two) times daily.            Discharge Care Instructions  (From admission, onward)        Start     Ordered   01/01/18 0000  Partial weight bearing    Question Answer Comment  % Body Weight  50% weight   Laterality left   Extremity Lower      01/01/18 1145     Follow-up Information    Mcarthur Rossetti, MD. Schedule an appointment as soon as possible for a visit in 2 week(s).   Specialty:  Orthopedic Surgery Contact information: [address] [place] [address] [phone]          Allergies  Allergen Reactions  . Aspirin     Consultations: Orthopedics  Procedures/Studies: Dg Chest 2 View  Result Date: 12/30/2017 CLINICAL DATA:  Fall, LEFT hip pain. EXAM: CHEST - 2 VIEW COMPARISON:  None. FINDINGS: Mild cardiomegaly. No confluent opacity to suggest a  developing pneumonia. No pleural effusion or pneumothorax seen. Probable atelectasis or confluent scarring at the LEFT costophrenic angle. Multiple RIGHT-sided rib fractures, of uncertain age, most likely chronic. Multiple compression fracture deformities within the mid and lower thoracic spine, of uncertain chronicity. IMPRESSION: 1. No active cardiopulmonary disease. No evidence of pneumonia or pulmonary edema. 2. Mild cardiomegaly. 3. Multiple RIGHT-sided rib fractures, of uncertain age, favor chronic. 4. Multiple compression fracture deformities within the mid and lower thoracic spine, of uncertain age but also most likely chronic. If any midline back pain, would consider CT for further characterization. Electronically Signed   By: Franki Cabot M.D.   On: 12/30/2017 20:55   Ct Head Wo Contrast  Result Date: 12/30/2017 CLINICAL DATA:  Patient slipped out of chair and fell on the floor. EXAM: CT HEAD WITHOUT CONTRAST CT CERVICAL SPINE WITHOUT CONTRAST TECHNIQUE: Multidetector CT imaging of the head and cervical spine was performed following the standard protocol without intravenous contrast. Multiplanar CT image reconstructions of the cervical spine were also generated. COMPARISON:  None. FINDINGS: CT HEAD FINDINGS Brain: There is no evidence for acute hemorrhage, hydrocephalus, mass lesion, or abnormal extra-axial fluid collection. No definite CT evidence for acute infarction. Diffuse loss of parenchymal volume is consistent with atrophy. Patchy low attenuation in the deep hemispheric and periventricular white matter is nonspecific, but likely reflects chronic microvascular ischemic demyelination. Vascular: No hyperdense vessel or unexpected calcification. Skull: Normal. Negative for fracture or focal lesion. Sinuses/Orbits: No acute finding. Other: None. CT CERVICAL SPINE FINDINGS Alignment: Normal. Skull base and vertebrae: No acute fracture. No primary bone lesion or focal pathologic process. Soft tissues  and spinal canal: No prevertebral fluid or swelling. No visible canal hematoma. Disc levels:  Loss of disc height noted at C2-3 in C5-6. Upper chest: Biapical pleuroparenchymal scarring. Other: None. IMPRESSION: 1. No acute intracranial abnormality. Atrophy with chronic small vessel white matter ischemic disease. 2. No cervical spine fracture. Degenerative changes at C2-3 and C5-6. Electronically Signed   By: Misty Stanley M.D.   On: 12/30/2017 21:11   Ct Chest W Contrast  Result Date: 12/31/2017 CLINICAL DATA:  [place] out of chair, possible rib fracture pain to the left hip EXAM: CT CHEST, ABDOMEN, AND PELVIS WITH CONTRAST TECHNIQUE: Multidetector CT imaging of the chest, abdomen and pelvis was performed following the standard protocol during bolus administration of intravenous contrast. CONTRAST:  116mL ISOVUE-300 IOPAMIDOL (ISOVUE-300) INJECTION 61% COMPARISON:  Radiograph 12/30/2017 FINDINGS: CT CHEST FINDINGS Cardiovascular: Nonaneurysmal aorta. Moderate aortic atherosclerosis. Normal heart size. Small pericardial effusion. Mediastinum/Nodes: Negative for hematoma. Midline trachea. Subcentimeter nodules in the right lobe. No significant adenopathy. Esophagus within normal limits Lungs/Pleura: Calcified nodule in the right lower lobe. No acute consolidation or effusion. Apical scarring on the right. Atelectasis at the left base. No pneumothorax Musculoskeletal: Sternum is intact. Old right  fourth, fifth, sixth, seventh, eighth, ninth and tenth posterior rib fractures. Age indeterminate moderate compression fractures at T4, T5, T8, T10, T11 and T12. CT ABDOMEN PELVIS FINDINGS Hepatobiliary: No focal liver abnormality is seen. No gallstones, gallbladder wall thickening, or biliary dilatation. Pancreas: Unremarkable. No pancreatic ductal dilatation or surrounding inflammatory changes. Spleen: Calcified granuloma Adrenals/Urinary Tract: Adrenal glands are unremarkable. Kidneys are normal, without renal calculi,  focal lesion, or hydronephrosis. Bladder is unremarkable. Stomach/Bowel: Stomach is within normal limits. Appendix appears normal. No evidence of bowel wall thickening, distention, or inflammatory changes. Sigmoid colon diverticular disease without acute inflammation Vascular/Lymphatic: Mild aortic atherosclerosis. No aneurysm. No significantly enlarged lymph nodes Reproductive: Calcified fibroids.  No adnexal mass Other: No free air or free fluid Musculoskeletal: Acute left femoral neck fracture. Age indeterminate mild inferior endplate deformity at L3. Mild to moderate age indeterminate compression fracture at L5. IMPRESSION: 1. No CT evidence for acute mediastinal injury. No CT evidence for acute solid organ injury within the abdomen or pelvis. No free air or free fluid. 2. Multiple age indeterminate moderate compression fractures T4-T5, T8, T10 through T12, and L3 and L5. No retropulsion. 3. Acute left femoral neck fracture 4. Sigmoid colon diverticular disease without acute inflammation Electronically Signed   By: Donavan Foil M.D.   On: 12/31/2017 00:04   Ct Cervical Spine Wo Contrast  Result Date: 12/30/2017 CLINICAL DATA:  Patient slipped out of chair and fell on the floor. EXAM: CT HEAD WITHOUT CONTRAST CT CERVICAL SPINE WITHOUT CONTRAST TECHNIQUE: Multidetector CT imaging of the head and cervical spine was performed following the standard protocol without intravenous contrast. Multiplanar CT image reconstructions of the cervical spine were also generated. COMPARISON:  None. FINDINGS: CT HEAD FINDINGS Brain: There is no evidence for acute hemorrhage, hydrocephalus, mass lesion, or abnormal extra-axial fluid collection. No definite CT evidence for acute infarction. Diffuse loss of parenchymal volume is consistent with atrophy. Patchy low attenuation in the deep hemispheric and periventricular white matter is nonspecific, but likely reflects chronic microvascular ischemic demyelination. Vascular: No  hyperdense vessel or unexpected calcification. Skull: Normal. Negative for fracture or focal lesion. Sinuses/Orbits: No acute finding. Other: None. CT CERVICAL SPINE FINDINGS Alignment: Normal. Skull base and vertebrae: No acute fracture. No primary bone lesion or focal pathologic process. Soft tissues and spinal canal: No prevertebral fluid or swelling. No visible canal hematoma. Disc levels:  Loss of disc height noted at C2-3 in C5-6. Upper chest: Biapical pleuroparenchymal scarring. Other: None. IMPRESSION: 1. No acute intracranial abnormality. Atrophy with chronic small vessel white matter ischemic disease. 2. No cervical spine fracture. Degenerative changes at C2-3 and C5-6. Electronically Signed   By: Misty Stanley M.D.   On: 12/30/2017 21:11   Ct Abdomen Pelvis W Contrast  Result Date: 12/31/2017 CLINICAL DATA:  [place] out of chair, possible rib fracture pain to the left hip EXAM: CT CHEST, ABDOMEN, AND PELVIS WITH CONTRAST TECHNIQUE: Multidetector CT imaging of the chest, abdomen and pelvis was performed following the standard protocol during bolus administration of intravenous contrast. CONTRAST:  180mL ISOVUE-300 IOPAMIDOL (ISOVUE-300) INJECTION 61% COMPARISON:  Radiograph 12/30/2017 FINDINGS: CT CHEST FINDINGS Cardiovascular: Nonaneurysmal aorta. Moderate aortic atherosclerosis. Normal heart size. Small pericardial effusion. Mediastinum/Nodes: Negative for hematoma. Midline trachea. Subcentimeter nodules in the right lobe. No significant adenopathy. Esophagus within normal limits Lungs/Pleura: Calcified nodule in the right lower lobe. No acute consolidation or effusion. Apical scarring on the right. Atelectasis at the left base. No pneumothorax Musculoskeletal: Sternum is intact. Old right fourth, fifth, sixth,  seventh, eighth, ninth and tenth posterior rib fractures. Age indeterminate moderate compression fractures at T4, T5, T8, T10, T11 and T12. CT ABDOMEN PELVIS FINDINGS Hepatobiliary: No focal liver  abnormality is seen. No gallstones, gallbladder wall thickening, or biliary dilatation. Pancreas: Unremarkable. No pancreatic ductal dilatation or surrounding inflammatory changes. Spleen: Calcified granuloma Adrenals/Urinary Tract: Adrenal glands are unremarkable. Kidneys are normal, without renal calculi, focal lesion, or hydronephrosis. Bladder is unremarkable. Stomach/Bowel: Stomach is within normal limits. Appendix appears normal. No evidence of bowel wall thickening, distention, or inflammatory changes. Sigmoid colon diverticular disease without acute inflammation Vascular/Lymphatic: Mild aortic atherosclerosis. No aneurysm. No significantly enlarged lymph nodes Reproductive: Calcified fibroids.  No adnexal mass Other: No free air or free fluid Musculoskeletal: Acute left femoral neck fracture. Age indeterminate mild inferior endplate deformity at L3. Mild to moderate age indeterminate compression fracture at L5. IMPRESSION: 1. No CT evidence for acute mediastinal injury. No CT evidence for acute solid organ injury within the abdomen or pelvis. No free air or free fluid. 2. Multiple age indeterminate moderate compression fractures T4-T5, T8, T10 through T12, and L3 and L5. No retropulsion. 3. Acute left femoral neck fracture 4. Sigmoid colon diverticular disease without acute inflammation Electronically Signed   By: Donavan Foil M.D.   On: 12/31/2017 00:04   Dg Finger Thumb Left  Result Date: 12/31/2017 CLINICAL DATA:  Fall with thumb pain EXAM: LEFT THUMB 2+V COMPARISON:  None. FINDINGS: Acute nondisplaced intra-articular fracture involving the dorsal base of the first distal phalanx. No subluxation. Mild degenerative change at the PIP joint IMPRESSION: Acute nondisplaced intra-articular fracture involving the base of the first distal phalanx Electronically Signed   By: Donavan Foil M.D.   On: 12/31/2017 00:47   Dg C-arm 1-60 Min-no Report  Result Date: 12/31/2017 Fluoroscopy was utilized by the  requesting physician.  No radiographic interpretation.   Dg Hip Operative Unilat W Or W/o Pelvis Left  Result Date: 12/31/2017 CLINICAL DATA:  Left hip repair EXAM: OPERATIVE LEFT HIP (WITH PELVIS IF PERFORMED) 6 VIEWS TECHNIQUE: Fluoroscopic spot image(s) were submitted for interpretation post-operatively. COMPARISON:  None. FINDINGS: Three screws were placed across the proximal femoral fracture throughout the study. IMPRESSION: Fracture repair as above. Electronically Signed   By: Dorise Bullion III M.D   On: 12/31/2017 12:07   Dg Hip Unilat With Pelvis 2-3 Views Left  Result Date: 12/30/2017 CLINICAL DATA:  Fall, LEFT hip pain. EXAM: DG HIP (WITH OR WITHOUT PELVIS) 2-3V LEFT COMPARISON:  None. FINDINGS: There is a slightly displaced fracture of the LEFT femoral neck, subcapital, with suspected impaction at the fracture site. Femoral head remains appropriately positioned relative to the acetabulum. No additional fracture identified within the osseous pelvis or about the RIGHT hip. Osteopenia limits characterization of osseous detail. Soft tissues about the LEFT hip are unremarkable. IMPRESSION: Slightly displaced fracture of the LEFT femoral neck, subcapital region. Electronically Signed   By: Franki Cabot M.D.   On: 12/30/2017 20:51       Subjective: Patient seen and examined the bedside this morning.  Remains comfortable.  No new issues/events.  Stable for discharge to skilled nursing facility today.   Discharge Exam: Vitals:   01/02/18 2252 01/03/18 0657  BP: (!) 149/62 (!) 172/66  Pulse: 98 89  Resp: 15 15  Temp: 98.6 F (37 C) 97.6 F (36.4 C)  SpO2: 94% 94%   Vitals:   01/02/18 0617 01/02/18 1403 01/02/18 2252 01/03/18 0657  BP: (!) 180/71 (!) 167/63 (!) 149/62 Marland Kitchen)  172/66  Pulse: 90 91 98 89  Resp: 16 16 15 15   Temp: 98.1 F (36.7 C) 98 F (36.7 C) 98.6 F (37 C) 97.6 F (36.4 C)  TempSrc: Oral Oral Oral Oral  SpO2: 94% 96% 94% 94%  Weight:      Height:         General: Pt is alert, awake, not in acute distress Cardiovascular: RRR, S1/S2 +, no rubs, no gallops Respiratory: CTA bilaterally, no wheezing, no rhonchi Abdominal: Soft, NT, ND, bowel sounds + Extremities: no edema, no cyanosis    The results of significant diagnostics from this hospitalization (including imaging, microbiology, ancillary and laboratory) are listed below for reference.     Microbiology: No results found for this or any previous visit (from the past 240 hour(s)).   Labs: BNP (last 3 results) No results for input(s): BNP in the last 8760 hours. Basic Metabolic Panel: Recent Labs  Lab 12/30/17 2201 12/31/17 0337  NA 138 137  K 3.7 3.8  CL 105 105  CO2 25 24  GLUCOSE 127* 137*  BUN 12 10  CREATININE 0.59 0.47  CALCIUM 8.8* 8.7*   Liver Function Tests: Recent Labs  Lab 12/31/17 0337  AST 18  ALT 13  ALKPHOS 75  BILITOT 0.9  PROT 6.6  ALBUMIN 3.5   No results for input(s): LIPASE, AMYLASE in the last 168 hours. No results for input(s): AMMONIA in the last 168 hours. CBC: Recent Labs  Lab 12/30/17 2201 12/31/17 0337 01/02/18 1121  WBC 12.0* 9.8 7.9  NEUTROABS 10.5* 8.7* 5.7  HGB 13.1 12.9 12.3  HCT 40.0 38.6 37.4  MCV 92.0 90.8 91.9  PLT 310 286 275   Cardiac Enzymes: No results for input(s): CKTOTAL, CKMB, CKMBINDEX, TROPONINI in the last 168 hours. BNP: Invalid input(s): POCBNP CBG: Recent Labs  Lab 12/31/17 0905 01/02/18 0800  GLUCAP 102* 122*   D-Dimer No results for input(s): DDIMER in the last 72 hours. Hgb A1c No results for input(s): HGBA1C in the last 72 hours. Lipid Profile No results for input(s): CHOL, HDL, LDLCALC, TRIG, CHOLHDL, LDLDIRECT in the last 72 hours. Thyroid function studies No results for input(s): TSH, T4TOTAL, T3FREE, THYROIDAB in the last 72 hours.  Invalid input(s): FREET3 Anemia work up No results for input(s): VITAMINB12, FOLATE, FERRITIN, TIBC, IRON, RETICCTPCT in the last 72  hours. Urinalysis No results found for: COLORURINE, APPEARANCEUR, LABSPEC, Naplate, GLUCOSEU, HGBUR, BILIRUBINUR, KETONESUR, PROTEINUR, UROBILINOGEN, NITRITE, LEUKOCYTESUR Sepsis Labs Invalid input(s): PROCALCITONIN,  WBC,  LACTICIDVEN Microbiology No results found for this or any previous visit (from the past 240 hour(s)).  Please note: You were cared for by a hospitalist during your hospital stay. Once you are discharged, your primary care physician will handle any further medical issues. Please note that NO REFILLS for any discharge medications will be authorized once you are discharged, as it is imperative that you return to your primary care physician (or establish a relationship with a primary care physician if you do not have one) for your post hospital discharge needs so that they can reassess your need for medications and monitor your lab values.    Time coordinating discharge: 40 minutes  SIGNED:   Shelly Coss, MD  [facility] 01/03/2018, 12:09 PM Pager [phone]  If 7PM-7AM, please contact night-coverage [email or web address] Password [email or web address]

## 2018-01-16 ENCOUNTER — Ambulatory Visit (INDEPENDENT_AMBULATORY_CARE_PROVIDER_SITE_OTHER): Payer: No Typology Code available for payment source

## 2018-01-16 ENCOUNTER — Ambulatory Visit (INDEPENDENT_AMBULATORY_CARE_PROVIDER_SITE_OTHER): Payer: Medicare Other | Admitting: Orthopaedic Surgery

## 2018-01-16 ENCOUNTER — Encounter (INDEPENDENT_AMBULATORY_CARE_PROVIDER_SITE_OTHER): Payer: Self-pay | Admitting: Orthopaedic Surgery

## 2018-01-16 DIAGNOSIS — S72002A Fracture of unspecified part of neck of left femur, initial encounter for closed fracture: Secondary | ICD-10-CM | POA: Diagnosis not present

## 2018-01-16 NOTE — Progress Notes (Signed)
The patient is a 84 year old nursing home resident who is following up after surgery on her left hip.  She is 16 days status post cannulated screw fixation of his nondisplaced left hip femoral neck fracture.  We have had her just touchdown weightbearing.  She seems to be doing well overall.  On exam her incision looks good so I removed staples and placed Steri-Strips.  She tolerates me putting her left hip the range of motion and compressing the hip with no withdrawal to pain or acting like this bothers her at all.  X-rays of her pelvis and left hip show 3 cannulated screws without any complicating features.  I gave notes to the facility letter to attempt weightbearing as tolerated but she is such a fall risk she should only ever be up with assistance.  We will see her back in 4 weeks with an AP and lateral of her left hip only we do not need to see the whole pelvis at that x-ray.

## 2018-02-16 ENCOUNTER — Encounter (INDEPENDENT_AMBULATORY_CARE_PROVIDER_SITE_OTHER): Payer: Self-pay | Admitting: Orthopaedic Surgery

## 2018-02-16 ENCOUNTER — Ambulatory Visit (INDEPENDENT_AMBULATORY_CARE_PROVIDER_SITE_OTHER): Payer: Medicare Other

## 2018-02-16 ENCOUNTER — Ambulatory Visit (INDEPENDENT_AMBULATORY_CARE_PROVIDER_SITE_OTHER): Payer: Medicare Other | Admitting: Orthopaedic Surgery

## 2018-02-16 DIAGNOSIS — S72002A Fracture of unspecified part of neck of left femur, initial encounter for closed fracture: Secondary | ICD-10-CM | POA: Diagnosis not present

## 2018-02-16 NOTE — Progress Notes (Signed)
The patient is mainly wheelchair-bound 84 year old female with some dementia who does follow commands appropriately.  She is 47 days status post cannulated screw fixation of a nondisplaced left femoral neck fracture.  She seems to be doing well overall according to her caregivers.  On exam I can easily put her left hip to flexion extension as well as internal extra rotation with no discomfort.  X-rays of her left hip also confirm intact cannulated screws with no evidence of osteonecrosis this for and it does appear the fracture is healing appropriately.  Since she is doing so well and is mainly wheelchair-bound follow-up will be as needed.  All questions concerns were answered and addressed.  If there is any issues at all with his hip but will let us know.

## 2018-03-09 DIAGNOSIS — E039 Hypothyroidism, unspecified: Secondary | ICD-10-CM | POA: Diagnosis not present

## 2018-03-09 DIAGNOSIS — F0390 Unspecified dementia without behavioral disturbance: Secondary | ICD-10-CM | POA: Diagnosis not present

## 2018-03-09 DIAGNOSIS — J309 Allergic rhinitis, unspecified: Secondary | ICD-10-CM | POA: Diagnosis not present

## 2018-03-28 DIAGNOSIS — I10 Essential (primary) hypertension: Secondary | ICD-10-CM | POA: Diagnosis not present

## 2018-03-28 DIAGNOSIS — S72002D Fracture of unspecified part of neck of left femur, subsequent encounter for closed fracture with routine healing: Secondary | ICD-10-CM | POA: Diagnosis not present

## 2018-03-28 DIAGNOSIS — R41841 Cognitive communication deficit: Secondary | ICD-10-CM | POA: Diagnosis not present

## 2018-03-28 DIAGNOSIS — B372 Candidiasis of skin and nail: Secondary | ICD-10-CM | POA: Diagnosis not present

## 2018-03-28 DIAGNOSIS — F0390 Unspecified dementia without behavioral disturbance: Secondary | ICD-10-CM | POA: Diagnosis not present

## 2018-03-28 DIAGNOSIS — J309 Allergic rhinitis, unspecified: Secondary | ICD-10-CM | POA: Diagnosis not present

## 2018-03-28 DIAGNOSIS — R296 Repeated falls: Secondary | ICD-10-CM | POA: Diagnosis not present

## 2018-03-28 DIAGNOSIS — R451 Restlessness and agitation: Secondary | ICD-10-CM | POA: Diagnosis not present

## 2018-03-28 DIAGNOSIS — M6281 Muscle weakness (generalized): Secondary | ICD-10-CM | POA: Diagnosis not present

## 2018-03-28 DIAGNOSIS — K5900 Constipation, unspecified: Secondary | ICD-10-CM | POA: Diagnosis not present

## 2018-03-28 DIAGNOSIS — E039 Hypothyroidism, unspecified: Secondary | ICD-10-CM | POA: Diagnosis not present

## 2018-03-28 DIAGNOSIS — M810 Age-related osteoporosis without current pathological fracture: Secondary | ICD-10-CM | POA: Diagnosis not present

## 2018-04-08 DIAGNOSIS — F0390 Unspecified dementia without behavioral disturbance: Secondary | ICD-10-CM | POA: Diagnosis not present

## 2018-04-08 DIAGNOSIS — E039 Hypothyroidism, unspecified: Secondary | ICD-10-CM | POA: Diagnosis not present

## 2018-05-04 DIAGNOSIS — E039 Hypothyroidism, unspecified: Secondary | ICD-10-CM | POA: Diagnosis not present

## 2018-05-05 DIAGNOSIS — E039 Hypothyroidism, unspecified: Secondary | ICD-10-CM | POA: Diagnosis not present

## 2018-05-05 DIAGNOSIS — S51019A Laceration without foreign body of unspecified elbow, initial encounter: Secondary | ICD-10-CM | POA: Diagnosis not present

## 2018-05-05 DIAGNOSIS — F0390 Unspecified dementia without behavioral disturbance: Secondary | ICD-10-CM | POA: Diagnosis not present

## 2018-06-01 DIAGNOSIS — F0390 Unspecified dementia without behavioral disturbance: Secondary | ICD-10-CM | POA: Diagnosis not present

## 2018-06-01 DIAGNOSIS — I10 Essential (primary) hypertension: Secondary | ICD-10-CM | POA: Diagnosis not present

## 2018-06-01 DIAGNOSIS — E039 Hypothyroidism, unspecified: Secondary | ICD-10-CM | POA: Diagnosis not present

## 2018-06-01 DIAGNOSIS — J309 Allergic rhinitis, unspecified: Secondary | ICD-10-CM | POA: Diagnosis not present

## 2018-06-09 DIAGNOSIS — F0390 Unspecified dementia without behavioral disturbance: Secondary | ICD-10-CM | POA: Diagnosis not present

## 2018-06-09 DIAGNOSIS — M810 Age-related osteoporosis without current pathological fracture: Secondary | ICD-10-CM | POA: Diagnosis not present

## 2018-06-09 DIAGNOSIS — E039 Hypothyroidism, unspecified: Secondary | ICD-10-CM | POA: Diagnosis not present

## 2018-06-09 DIAGNOSIS — I10 Essential (primary) hypertension: Secondary | ICD-10-CM | POA: Diagnosis not present

## 2018-06-27 DIAGNOSIS — I10 Essential (primary) hypertension: Secondary | ICD-10-CM | POA: Diagnosis not present

## 2018-06-27 DIAGNOSIS — E039 Hypothyroidism, unspecified: Secondary | ICD-10-CM | POA: Diagnosis not present

## 2018-06-27 DIAGNOSIS — F0390 Unspecified dementia without behavioral disturbance: Secondary | ICD-10-CM | POA: Diagnosis not present

## 2018-12-13 ENCOUNTER — Inpatient Hospital Stay (HOSPITAL_COMMUNITY): Payer: Medicare Other

## 2018-12-13 ENCOUNTER — Emergency Department (HOSPITAL_COMMUNITY): Payer: Medicare Other

## 2018-12-13 ENCOUNTER — Other Ambulatory Visit: Payer: Self-pay

## 2018-12-13 ENCOUNTER — Encounter (HOSPITAL_COMMUNITY): Payer: Self-pay | Admitting: Emergency Medicine

## 2018-12-13 ENCOUNTER — Inpatient Hospital Stay (HOSPITAL_COMMUNITY)
Admission: EM | Admit: 2018-12-13 | Discharge: 2018-12-17 | DRG: 065 | Disposition: A | Discharge status: Skilled Nursing Facility | Payer: Medicare Other | Attending: Internal Medicine | Admitting: Internal Medicine

## 2018-12-13 DIAGNOSIS — I6300 Cerebral infarction due to thrombosis of unspecified precerebral artery: Secondary | ICD-10-CM | POA: Diagnosis not present

## 2018-12-13 DIAGNOSIS — F0390 Unspecified dementia without behavioral disturbance: Secondary | ICD-10-CM | POA: Diagnosis present

## 2018-12-13 DIAGNOSIS — J309 Allergic rhinitis, unspecified: Secondary | ICD-10-CM | POA: Diagnosis present

## 2018-12-13 DIAGNOSIS — I10 Essential (primary) hypertension: Secondary | ICD-10-CM | POA: Diagnosis present

## 2018-12-13 DIAGNOSIS — Z85820 Personal history of malignant melanoma of skin: Secondary | ICD-10-CM | POA: Diagnosis not present

## 2018-12-13 DIAGNOSIS — I080 Rheumatic disorders of both mitral and aortic valves: Secondary | ICD-10-CM | POA: Diagnosis present

## 2018-12-13 DIAGNOSIS — G8191 Hemiplegia, unspecified affecting right dominant side: Secondary | ICD-10-CM | POA: Diagnosis present

## 2018-12-13 DIAGNOSIS — E039 Hypothyroidism, unspecified: Secondary | ICD-10-CM | POA: Diagnosis present

## 2018-12-13 DIAGNOSIS — Z1159 Encounter for screening for other viral diseases: Secondary | ICD-10-CM | POA: Diagnosis not present

## 2018-12-13 DIAGNOSIS — E785 Hyperlipidemia, unspecified: Secondary | ICD-10-CM | POA: Diagnosis present

## 2018-12-13 DIAGNOSIS — I340 Nonrheumatic mitral (valve) insufficiency: Secondary | ICD-10-CM | POA: Diagnosis not present

## 2018-12-13 DIAGNOSIS — Z79890 Hormone replacement therapy (postmenopausal): Secondary | ICD-10-CM

## 2018-12-13 DIAGNOSIS — I6340 Cerebral infarction due to embolism of unspecified cerebral artery: Secondary | ICD-10-CM | POA: Diagnosis not present

## 2018-12-13 DIAGNOSIS — Z833 Family history of diabetes mellitus: Secondary | ICD-10-CM

## 2018-12-13 DIAGNOSIS — Z823 Family history of stroke: Secondary | ICD-10-CM

## 2018-12-13 DIAGNOSIS — G319 Degenerative disease of nervous system, unspecified: Secondary | ICD-10-CM | POA: Diagnosis present

## 2018-12-13 DIAGNOSIS — Z66 Do not resuscitate: Secondary | ICD-10-CM | POA: Diagnosis present

## 2018-12-13 DIAGNOSIS — I639 Cerebral infarction, unspecified: Secondary | ICD-10-CM

## 2018-12-13 DIAGNOSIS — Z8042 Family history of malignant neoplasm of prostate: Secondary | ICD-10-CM | POA: Diagnosis not present

## 2018-12-13 DIAGNOSIS — IMO0002 Reserved for concepts with insufficient information to code with codable children: Secondary | ICD-10-CM

## 2018-12-13 DIAGNOSIS — I361 Nonrheumatic tricuspid (valve) insufficiency: Secondary | ICD-10-CM | POA: Diagnosis not present

## 2018-12-13 DIAGNOSIS — Z886 Allergy status to analgesic agent status: Secondary | ICD-10-CM

## 2018-12-13 LAB — COMPREHENSIVE METABOLIC PANEL
ALT: 14 U/L (ref 0–44)
AST: 24 U/L (ref 15–41)
Albumin: 3.5 g/dL (ref 3.5–5.0)
Alkaline Phosphatase: 59 U/L (ref 38–126)
Anion gap: 8 (ref 5–15)
BUN: 12 mg/dL (ref 8–23)
CO2: 25 mmol/L (ref 22–32)
Calcium: 9.2 mg/dL (ref 8.9–10.3)
Chloride: 102 mmol/L (ref 98–111)
Creatinine, Ser: 0.64 mg/dL (ref 0.44–1.00)
GFR calc Af Amer: >60 mL/min (ref >60)
GFR calc non Af Amer: >60 mL/min (ref >60)
Glucose, Bld: 117 mg/dL — ABNORMAL HIGH (ref 70–99)
Potassium: 3.9 mmol/L (ref 3.5–5.1)
Sodium: 135 mmol/L (ref 135–145)
Total Bilirubin: 0.5 mg/dL (ref 0.3–1.2)
Total Protein: 6.9 g/dL (ref 6.5–8.1)

## 2018-12-13 LAB — DIFFERENTIAL
Abs Immature Granulocytes: 0.02 10*3/uL (ref 0.00–0.07)
Basophils Absolute: 0 10*3/uL (ref 0.0–0.1)
Basophils Relative: 0 %
Eosinophils Absolute: 0 10*3/uL (ref 0.0–0.5)
Eosinophils Relative: 0 %
Immature Granulocytes: 0 %
Lymphocytes Relative: 16 %
Lymphs Abs: 1.2 10*3/uL (ref 0.7–4.0)
Monocytes Absolute: 0.5 10*3/uL (ref 0.1–1.0)
Monocytes Relative: 7 %
Neutro Abs: 5.8 10*3/uL (ref 1.7–7.7)
Neutrophils Relative %: 77 %

## 2018-12-13 LAB — ETHANOL: Alcohol, Ethyl (B): <10 mg/dL (ref <10)

## 2018-12-13 LAB — CBC
HCT: 42 % (ref 36.0–46.0)
Hemoglobin: 13.5 g/dL (ref 12.0–15.0)
MCH: 29.7 pg (ref 26.0–34.0)
MCHC: 32.1 g/dL (ref 30.0–36.0)
MCV: 92.5 fL (ref 80.0–100.0)
Platelets: 197 10*3/uL (ref 150–400)
RBC: 4.54 MIL/uL (ref 3.87–5.11)
RDW: 13.6 % (ref 11.5–15.5)
WBC: 7.5 10*3/uL (ref 4.0–10.5)
nRBC: 0 % (ref 0.0–0.2)

## 2018-12-13 LAB — PROTIME-INR
INR: 1.1 (ref 0.8–1.2)
Prothrombin Time: 13.9 seconds (ref 11.4–15.2)

## 2018-12-13 LAB — I-STAT CHEM 8, ED
BUN: 16 mg/dL (ref 8–23)
Calcium, Ion: 1.21 mmol/L (ref 1.15–1.40)
Chloride: 101 mmol/L (ref 98–111)
Creatinine, Ser: 0.6 mg/dL (ref 0.44–1.00)
Glucose, Bld: 111 mg/dL — ABNORMAL HIGH (ref 70–99)
HCT: 41 % (ref 36.0–46.0)
Hemoglobin: 13.9 g/dL (ref 12.0–15.0)
Potassium: 3.9 mmol/L (ref 3.5–5.1)
Sodium: 134 mmol/L — ABNORMAL LOW (ref 135–145)
TCO2: 26 mmol/L (ref 22–32)

## 2018-12-13 LAB — APTT: aPTT: 38 seconds — ABNORMAL HIGH (ref 24–36)

## 2018-12-13 MED ORDER — ATORVASTATIN CALCIUM 10 MG PO TABS: 10.0000 mg | ORAL_TABLET | Freq: Every day | ORAL | Status: DC

## 2018-12-13 MED ORDER — HYDRALAZINE HCL 20 MG/ML IJ SOLN: 5.0000 mg | Freq: Four times a day (QID) | INTRAMUSCULAR | Status: DC | PRN

## 2018-12-13 MED ORDER — ENOXAPARIN SODIUM 30 MG/0.3ML ~~LOC~~ SOLN
30.0000 mg | Freq: Every day | SUBCUTANEOUS | Status: DC
  Administered 2018-12-13 – 2018-12-16 (×4): 30 mg via SUBCUTANEOUS
  Filled 2018-12-13 (×4): qty 0.3

## 2018-12-13 MED ORDER — LEVOTHYROXINE SODIUM 75 MCG PO TABS
75.0000 ug | ORAL_TABLET | Freq: Every day | ORAL | Status: DC
  Administered 2018-12-14 – 2018-12-17 (×4): 75 ug via ORAL
  Filled 2018-12-13 (×4): qty 1

## 2018-12-13 MED ORDER — IOHEXOL 350 MG/ML SOLN
100.0000 mL | Freq: Once | INTRAVENOUS | Status: AC | PRN
  Administered 2018-12-13: 100 mL via INTRAVENOUS

## 2018-12-13 MED ORDER — GADOBUTROL 1 MMOL/ML IV SOLN
5.0000 mL | Freq: Once | INTRAVENOUS | Status: AC | PRN
  Administered 2018-12-13: 5 mL via INTRAVENOUS

## 2018-12-13 MED ORDER — SODIUM CHLORIDE 0.9 % IV SOLN
INTRAVENOUS | Status: DC
  Administered 2018-12-13: 23:00:00 via INTRAVENOUS

## 2018-12-13 NOTE — H&P (Signed)
History and Physical    Patricia Gilbert ZTI:[id number] DOB: 10/17/1925 DOA: 12/13/2018  PCP: Patricia Gilbert, No Pcp Per Patricia Gilbert coming from: Nursing home  Chief Complaint: Right-sided weakness HPI: Patricia Gilbert is a 85 y.o. female with medical history significant of hypertension hypothyroidism who was sent in from the nursing home as a staff noticed right-sided weakness while at lunch.  Apparently she was seen by her family this weekend and she was at her baseline.  She has a history of dementia.  She is able to speak and answer some of the questions.  She thinks [facility] is at the [facility] she has 2 children.  She denies any headache nausea vomiting diarrhea chest pain shortness of breath cough fever chills abdominal pain.  ED Course: Sodium 134 potassium 3.9 BUN 16 creatinine 0.60 hemoglobin 13.9 platelet count 197 white count 7.5.  CT of the head shows chronic ischemic changes and moderate atrophy.  ED physician discussed with Dr. Malen Gauze and recommended stroke work-up. Review of Systems: As per HPI otherwise all other systems reviewed and are negative  Ambulatory Status  Past Medical History:  Diagnosis Date  . Allergic rhinitis, cause unspecified   . Hypothyroid   . Melanoma ([place]) 1997  . Sinus tachycardia   . Syncope 08/20/2011   single event, neg echo and cards eval     Past Surgical History:  Procedure Laterality Date  . CESAREAN SECTION     [id number]  . HIP PINNING,CANNULATED Left 12/31/2017   Procedure: CANNULATED HIP PINNING;  Surgeon: Mcarthur Rossetti, MD;  Location: [facility];  Service: Orthopedics;  Laterality: Left;  Marland Kitchen MELANOMA EXCISION  1997    Social History   Socioeconomic History  . Marital status: Married    Spouse name: Not on file  . Number of children: Not on file  . Years of education: Not on file  . Highest education level: Not on file  Occupational History  . Not on file  Social Needs  . Financial resource strain: Not on file  . Food insecurity   Worry: Not on file    Inability: Not on file  . Transportation needs    Medical: Not on file    Non-medical: Not on file  Tobacco Use  . Smoking status: Never Smoker  . Smokeless tobacco: Never Used  Substance and Sexual Activity  . Alcohol use: Yes  . Drug use: No  . Sexual activity: Not on file  Lifestyle  . Physical activity    Days per week: Not on file    Minutes per session: Not on file  . Stress: Not on file  Relationships  . Social Herbalist on phone: Not on file    Gets together: Not on file    Attends religious service: Not on file    Active member of club or organization: Not on file    Attends meetings of clubs or organizations: Not on file    Relationship status: Not on file  . Intimate partner violence    Fear of current or ex partner: Not on file    Emotionally abused: Not on file    Physically abused: Not on file    Forced sexual activity: Not on file  Other Topics Concern  . Not on file  Social History Narrative  . Not on file    Allergies  Allergen Reactions  . Aspirin   . Codeine Palpitations    Family History  Problem Relation Age of Onset  .  Prostate cancer Father   . Diabetes Other   . Stroke Other     Prior to Admission medications   Medication Sig Start Date End Date Taking? Authorizing Provider  acetaminophen (TYLENOL) 325 MG tablet Take 1-2 tablets (325-650 mg total) by mouth every 6 (six) hours as needed for mild pain (pain score 1-3 or temp > 100.5). 01/03/18  Yes Shelly Coss, MD  alendronate (FOSAMAX) 70 MG tablet Take 1 tablet (70 mg total) by mouth every 7 (seven) days. Take with a full glass of water on an empty stomach. Patricia Gilbert taking differently: Take 70 mg by mouth every 7 (seven) days. Take with a full glass of water on an empty stomach. Thursday 01/09/13  Yes Rowe Clack, MD  docusate sodium (COLACE) 100 MG capsule Take 1 capsule (100 mg total) by mouth 2 (two) times daily. Patricia Gilbert taking differently:  Take 100 mg by mouth daily.  01/03/18  Yes Shelly Coss, MD  levothyroxine (SYNTHROID, LEVOTHROID) 50 MCG tablet Take 1 tablet (50 mcg total) by mouth daily. Patricia Gilbert taking differently: Take 75 mcg by mouth daily.  05/09/14  Yes Rowe Clack, MD  losartan (COZAAR) 25 MG tablet Take 25 mg by mouth daily.   Yes [provider]  meclizine (ANTIVERT) 12.5 MG tablet Take 12.5 mg by mouth every 4 (four) hours as needed for dizziness.   Yes [provider]  senna (SENOKOT) 8.6 MG TABS tablet Take 1 tablet by mouth daily as needed for mild constipation.   Yes [provider]  zinc oxide 20 % ointment Apply 1 application topically as needed for irritation (groin).   Yes [provider]  amLODipine (NORVASC) 10 MG tablet Take 1 tablet (10 mg total) by mouth daily. Patricia Gilbert not taking: Reported on 12/13/2018 01/04/18   Shelly Coss, MD    Physical Exam: Vitals:   12/13/18 1513 12/13/18 1519 12/13/18 1600 12/13/18 1745  BP:  (!) 160/59 (!) 141/47 (!) 166/55  Pulse: 82 78 74   Resp: 16 16 18 15   Temp:  98.6 F (37 C)    TempSrc:  Oral    SpO2:  98% 97%   Weight: 45.5 kg        . General: Appears calm and comfortable . Eyes:  PERRL, EOMI, normal lids, iris . ENT:  grossly normal hearing, lips & tongue, mmm . Neck:  no LAD, masses or thyromegaly . Cardiovascular:  RRR, no m/r/g. No LE edema.  Marland Kitchen Respiratory:CTA bilaterally, no w/r/r. Normal respiratory effort. . Abdomen:  soft, ntnd, NABS . Skin:  no rash or induration seen on limited exam . Musculoskeletal:  grossly normal tone BUE/BLE, good ROM, no bony abnormality . Psychiatric:  grossly normal mood and affect, speech fluent and appropriate, AOx3 . Neurologic she is awake she thinks she is ambulating pressure levels she answers most of my questions appropriately.  She is 2 x 5 in the right upper and right lower extremity and 4 x 5 in the left upper and left lower extremity.  Labs on Admission: I  have personally reviewed following labs and imaging studies  CBC: Recent Labs  Lab 12/13/18 1523 12/13/18 1540  WBC 7.5  --   NEUTROABS 5.8  --   HGB 13.5 13.9  HCT 42.0 41.0  MCV 92.5  --   PLT 197  --    Basic Metabolic Panel: Recent Labs  Lab 12/13/18 1523 12/13/18 1540  NA 135 134*  K 3.9 3.9  CL 102 101  CO2 25  --   GLUCOSE 117* 111*  BUN 12 16  CREATININE 0.64 0.60  CALCIUM 9.2  --    GFR: CrCl cannot be calculated (Unknown ideal weight.). Liver Function Tests: Recent Labs  Lab 12/13/18 1523  AST 24  ALT 14  ALKPHOS 59  BILITOT 0.5  PROT 6.9  ALBUMIN 3.5   No results for input(s): LIPASE, AMYLASE in the last 168 hours. No results for input(s): AMMONIA in the last 168 hours. Coagulation Profile: Recent Labs  Lab 12/13/18 1523  INR 1.1   Cardiac Enzymes: No results for input(s): CKTOTAL, CKMB, CKMBINDEX, TROPONINI in the last 168 hours. BNP (last 3 results) No results for input(s): PROBNP in the last 8760 hours. HbA1C: No results for input(s): HGBA1C in the last 72 hours. CBG: No results for input(s): GLUCAP in the last 168 hours. Lipid Profile: No results for input(s): CHOL, HDL, LDLCALC, TRIG, CHOLHDL, LDLDIRECT in the last 72 hours. Thyroid Function Tests: No results for input(s): TSH, T4TOTAL, FREET4, T3FREE, THYROIDAB in the last 72 hours. Anemia Panel: No results for input(s): VITAMINB12, FOLATE, FERRITIN, TIBC, IRON, RETICCTPCT in the last 72 hours. Urine analysis: No results found for: COLORURINE, APPEARANCEUR, LABSPEC, PHURINE, GLUCOSEU, HGBUR, BILIRUBINUR, KETONESUR, PROTEINUR, UROBILINOGEN, NITRITE, LEUKOCYTESUR  Creatinine Clearance: CrCl cannot be calculated (Unknown ideal weight.).  Sepsis Labs: @LABRCNTIP (procalcitonin:4,lacticidven:4) )No results found for this or any previous visit (from the past 240 hour(s)).   Radiological Exams on Admission: Ct Head Wo Contrast  Result Date: 12/13/2018 CLINICAL DATA:  Altered level  of consciousness. Right-sided weakness. EXAM: CT HEAD WITHOUT CONTRAST TECHNIQUE: Contiguous axial images were obtained from the base of the skull through the vertex without intravenous contrast. COMPARISON:  CT head 12/30/2017 FINDINGS: Brain: Moderate atrophy. Moderate chronic white matter changes are stable from the prior study. Small chronic infarct left cerebellum unchanged. Negative for acute infarct, hemorrhage, or mass.  No midline shift. Vascular: Negative for hyperdense vessel Skull: Negative Sinuses/Orbits: Mucosal edema right maxillary sinus. Associated Peri dental lucency around right upper molars. No orbital lesion. Other: None IMPRESSION: No acute intracranial abnormality. Moderate atrophy and moderate chronic ischemic changes in the white matter appear stable from the prior CT. Electronically Signed   By: Franchot Gallo M.D.   On: 12/13/2018 16:59      Assessment/Plan Active Problems:   CVA (cerebral vascular accident) [facility])   Possible stroke with right-sided weakness-Patricia Gilbert admitted with right upper and lower extremity weakness witnessed by the staff at the nursing home.  CT scan of the head does not show any acute finding.  Check MRI of the brain with CT angiogram of the head and neck, hemoglobin A1c TSH lipid panel echocardiogram.  I will  place her on statin low-dose.  Patricia Gilbert has documented allergies to aspirin not sure what the allergy is.  I will hold off on starting aspirin.  Essential hypertension Patricia Gilbert is on Cozaar at home which will be held allow permissive hypertension.  Blood pressure here is 166/55.  Hypothyroidism continue Synthroid check TSH  Severity of Illness: The appropriate Patricia Gilbert status for this Patricia Gilbert is INPATIENT. Inpatient status is judged to be reasonable and necessary in order to provide the required intensity of service to ensure the Patricia Gilbert's safety. The Patricia Gilbert's presenting symptoms, physical exam findings, and initial radiographic and laboratory  data in the context of their chronic comorbidities is felt to place them at high risk for further clinical deterioration. Furthermore, it is not anticipated that the Patricia Gilbert will be medically stable for  discharge from the hospital within 2 midnights of admission. The following factors support the Patricia Gilbert status of inpatient.   " The Patricia Gilbert's presenting symptoms include right upper and lower extremity weakness " The worrisome physical exam findings include localized to right upper and lower extremity weakness " The initial radiographic and laboratory data are worrisome because of stroke " The chronic co-morbidities include hypertension dementia and hypothyroidism   * I certify that at the point of admission it is my clinical judgment that the Patricia Gilbert will require inpatient hospital care spanning beyond 2 midnights from the point of admission due to high intensity of service, high risk for further deterioration and high frequency of surveillance required.*   Estimated body mass index is 18.33 kg/m as calculated from the following:   Height as of 01/01/18: 5\' 2"  (1.575 m).   Weight as of this encounter: 45.5 kg.   DVT prophylaxis: Lovenox Code Status: DNR Family Communication: Unable to reach family Disposition Plan: Pending clinical progress Consults called: ED physician discussed with neurology Admission status: Inpatient   Georgette Shell MD [facility]  If 7PM-7AM, please contact night-coverage [email or web address] Password [facility]  12/13/2018, 6:08 PM

## 2018-12-13 NOTE — ED Notes (Signed)
Attempted report x1. 

## 2018-12-13 NOTE — ED Triage Notes (Addendum)
[facility]- pt here from [facility] Pt is having right sided weakness. No last known well. Over the weekend the facility reported pt wasn't acting right. Pt on no blood thinners. Pt is a DNR. Pt has dementia.  141/52 75 HR 14 RR 97% RA 172 CBG 97.3 temp

## 2018-12-13 NOTE — ED Provider Notes (Signed)
[facility] EMERGENCY DEPARTMENT Provider Note   CSN: [id number] Arrival date & time: 12/13/18  1504     History   Chief Complaint No chief complaint on file.   HPI Patricia Gilbert is a 85 y.o. female.     85 year old female with history of dementia from nursing home presents with right-sided weakness with unknown last seen normal.  Patient was seen by her family this weekend and this was not reported.  Today while patient was at lunch she was noted to be using her nondominant left hand to eat.  Her right upper extremity appeared to be floppy.  She was sent here for further evaluation.  Patient is a DNR     Past Medical History:  Diagnosis Date  . Allergic rhinitis, cause unspecified   . Hypothyroid   . Melanoma ([place]) 1997  . Sinus tachycardia   . Syncope 08/20/2011   single event, neg echo and cards eval     Patient Active Problem List   Diagnosis Date Noted  . HTN (hypertension) 01/02/2018  . Hip fracture ([place]) 12/31/2017  . Left displaced femoral neck fracture ([place]) 12/31/2017  . Wound of left leg 03/13/2015  . Osteoporosis, senile 01/09/2013  . Allergic rhinitis, cause unspecified   . Hypothyroid   . Mild dementia ([place])   . Syncope 08/20/2011  . Sinus tachycardia 08/20/2011    Past Surgical History:  Procedure Laterality Date  . CESAREAN SECTION     [id number]  . HIP PINNING,CANNULATED Left 12/31/2017   Procedure: CANNULATED HIP PINNING;  Surgeon: Mcarthur Rossetti, MD;  Location: [facility];  Service: Orthopedics;  Laterality: Left;  Marland Kitchen MELANOMA EXCISION  1997     OB History   No obstetric history on file.      Home Medications    Prior to Admission medications   Medication Sig Start Date End Date Taking? Authorizing Provider  acetaminophen (TYLENOL) 325 MG tablet Take 1-2 tablets (325-650 mg total) by mouth every 6 (six) hours as needed for mild pain (pain score 1-3 or temp > 100.5). 01/03/18   Shelly Coss, MD  alendronate  (FOSAMAX) 70 MG tablet Take 1 tablet (70 mg total) by mouth every 7 (seven) days. Take with a full glass of water on an empty stomach. Patient not taking: Reported on 12/30/2017 01/09/13   Rowe Clack, MD  amLODipine (NORVASC) 10 MG tablet Take 1 tablet (10 mg total) by mouth daily. 01/04/18   Shelly Coss, MD  docusate sodium (COLACE) 100 MG capsule Take 1 capsule (100 mg total) by mouth 2 (two) times daily. 01/03/18   Shelly Coss, MD  levothyroxine (SYNTHROID, LEVOTHROID) 50 MCG tablet Take 1 tablet (50 mcg total) by mouth daily. Patient not taking: Reported on 12/30/2017 05/09/14   Rowe Clack, MD  lidocaine (LIDODERM) 5 % Place 1 patch onto the skin. To lower back daily in the am and remove at bedtime as needed    [provider]  nystatin (NYSTATIN) powder Apply 1 g topically 2 (two) times daily.    [provider]    Family History Family History  Problem Relation Age of Onset  . Prostate cancer Father   . Diabetes Other   . Stroke Other     Social History Social History   Tobacco Use  . Smoking status: Never Smoker  . Smokeless tobacco: Never Used  Substance Use Topics  . Alcohol use: Yes  . Drug use: No     Allergies  Aspirin   Review of Systems Review of Systems  Unable to perform ROS: Dementia     Physical Exam Updated Vital Signs BP (!) 160/59 (BP Location: Left Arm)   Pulse 78   Temp 98.6 F (37 C) (Oral)   Resp 16   Wt 45.5 kg   SpO2 98%   BMI 18.33 kg/m   Physical Exam Vitals signs and nursing note reviewed.  Constitutional:      General: She is not in acute distress.    Appearance: Normal appearance. She is well-developed. She is not toxic-appearing.  HENT:     Head: Normocephalic and atraumatic.  Eyes:     General: Lids are normal.     Conjunctiva/sclera: Conjunctivae normal.     Pupils: Pupils are equal, round, and reactive to light.  Neck:     Musculoskeletal: Normal range of motion and neck supple.      Thyroid: No thyroid mass.     Trachea: No tracheal deviation.  Cardiovascular:     Rate and Rhythm: Normal rate and regular rhythm.     Heart sounds: Normal heart sounds. No murmur. No gallop.   Pulmonary:     Effort: Pulmonary effort is normal. No respiratory distress.     Breath sounds: Normal breath sounds. No stridor. No decreased breath sounds, wheezing, rhonchi or rales.  Abdominal:     General: Bowel sounds are normal. There is no distension.     Palpations: Abdomen is soft.     Tenderness: There is no abdominal tenderness. There is no rebound.  Musculoskeletal: Normal range of motion.        General: No tenderness.  Skin:    General: Skin is warm and dry.     Findings: No abrasion or rash.  Neurological:     Mental Status: She is alert. She is disoriented.     GCS: GCS eye subscore is 4. GCS verbal subscore is 5. GCS motor subscore is 6.     Cranial Nerves: Cranial nerve deficit present. No dysarthria.     Sensory: No sensory deficit.     Motor: Weakness and atrophy present. No tremor.     Comments: Right upper and right lower extremity strength 2 out of 5.  No facial asymmetry.  Psychiatric:        Speech: Speech normal.        Behavior: Behavior normal.      ED Treatments / Results  Labs (all labs ordered are listed, but only abnormal results are displayed) Labs Reviewed  ETHANOL  PROTIME-INR  APTT  CBC  DIFFERENTIAL  COMPREHENSIVE METABOLIC PANEL  RAPID URINE DRUG SCREEN, HOSP PERFORMED  URINALYSIS, ROUTINE W REFLEX MICROSCOPIC  I-STAT CHEM 8, ED    EKG EKG Interpretation  Date/Time:  Wednesday December 13 2018 15:15:32 EDT Ventricular Rate:  80 PR Interval:    QRS Duration: 96 QT Interval:  386 QTC Calculation: 446 R Axis:   -27 Text Interpretation:  Sinus rhythm Short PR interval Left ventricular hypertrophy Artifact in lead(s) I III aVL No old tracing to compare Confirmed by Lacretia Leigh ([id number]) on 12/13/2018 3:23:40 PM Also confirmed by Lacretia Leigh ([id number]), editor Philomena Doheny [phone])  on 12/13/2018 3:42:24 PM   Radiology No results found.  Procedures Procedures (including critical care time)  Medications Ordered in ED Medications - No data to display   Initial Impression / Assessment and Plan / ED Course  I have reviewed the triage vital signs and the nursing notes.  Pertinent labs & imaging results that were available during my care of the patient were reviewed by me and considered in my medical decision making (see chart for details).        Case discussed with Dr. Malen Gauze from neurology who has reviewed the patient's CT scan.  Concern for new strokes.  Will see the patient in consultation.  Attempted to contact patient's family unsuccessfully.  Will admit for stroke work-up.  Final Clinical Impressions(s) / ED Diagnoses   Final diagnoses:  None    ED Discharge Orders    None       Lacretia Leigh, MD 12/13/18 1728

## 2018-12-13 NOTE — ED Notes (Signed)
ED TO INPATIENT HANDOFF REPORT  ED Nurse Name and Phone #: Monta Maiorana   S Name/Age/Gender Patricia Gilbert 85 y.o. female Room/Bed: 024C/024C  Code Status   Code Status: DNR  Home/SNF/Other Nursing Home Patient oriented to: self Is this baseline? Yes   Triage Complete: Triage complete  Chief Complaint stroke sx  Triage Note [facility]- [facility]. Pt is having right sided weakness. No last known well. Over the weekend the facility reported pt wasn't acting right. Pt on no blood thinners. Pt is a DNR. Pt has dementia.  141/52 75 HR 14 RR 97% RA 172 CBG 97.3 temp   Allergies Allergies  Allergen Reactions  . Aspirin   . Codeine Palpitations    Level of Care/Admitting Diagnosis ED Disposition    ED Disposition Condition [facility] Area: [place] [[id number]]  Level of Care: Telemetry Medical [104]  Covid Evaluation: N/A  Diagnosis: CVA (cerebral vascular accident) [facility]) [[id number]]  Admitting Physician: Georgette Shell [[id number]]  Attending Physician: Georgette Shell [[id number]]  Estimated length of stay: 3 - 4 days  Certification:: I certify this patient will need inpatient services for at least 2 midnights  PT Class (Do Not Modify): Inpatient [101]  PT Acc Code (Do Not Modify): Private [1]       B Medical/Surgery History Past Medical History:  Diagnosis Date  . Allergic rhinitis, cause unspecified   . Hypothyroid   . Melanoma ([place]) 1997  . Sinus tachycardia   . Syncope 08/20/2011   single event, neg echo and cards eval    Past Surgical History:  Procedure Laterality Date  . CESAREAN SECTION     [id number]  . HIP PINNING,CANNULATED Left 12/31/2017   Procedure: CANNULATED HIP PINNING;  Surgeon: Mcarthur Rossetti, MD;  Location: [facility];  Service: Orthopedics;  Laterality: Left;  Marland Kitchen MELANOMA EXCISION  1997     A IV Location/Drains/Wounds Patient Lines/Drains/Airways Status   Active  Line/Drains/Airways    Name:   Placement date:   Placement time:   Site:   Days:   Peripheral IV 12/13/18 Left Antecubital   12/13/18    1737    Antecubital   less than 1   External Urinary Catheter   12/31/17    0912    -   347   External Urinary Catheter   12/13/18    1528    -   less than 1   Incision (Closed) 12/31/17 Hip Left   12/31/17    1155     347          Intake/Output Last 24 hours No intake or output data in the 24 hours ending 12/13/18 1829  Labs/Imaging Results for orders placed or performed during the hospital encounter of 12/13/18 (from the past 48 hour(s))  Ethanol     Status: None   Collection Time: 12/13/18  3:23 PM  Result Value Ref Range   Alcohol, Ethyl (B) <10 <10 mg/dL    Comment: (NOTE) Lowest detectable limit for serum alcohol is 10 mg/dL. For medical purposes only. Performed at [facility], [place] [address] [place], [place] [address]   Protime-INR     Status: None   Collection Time: 12/13/18  3:23 PM  Result Value Ref Range   Prothrombin Time 13.9 11.4 - 15.2 seconds   INR 1.1 0.8 - 1.2    Comment: (NOTE) INR goal varies based on device and disease states. Performed at  [facility], [place] [address], [place], [place] [address]   APTT     Status: Abnormal   Collection Time: 12/13/18  3:23 PM  Result Value Ref Range   aPTT 38 (H) 24 - 36 seconds    Comment:        IF BASELINE aPTT IS ELEVATED, SUGGEST PATIENT RISK ASSESSMENT BE USED TO DETERMINE APPROPRIATE ANTICOAGULANT THERAPY. Performed at [facility], [place] [address] [place], [place] [address]   CBC     Status: None   Collection Time: 12/13/18  3:23 PM  Result Value Ref Range   WBC 7.5 4.0 - 10.5 K/uL   RBC 4.54 3.87 - 5.11 MIL/uL   Hemoglobin 13.5 12.0 - 15.0 g/dL   HCT 42.0 36.0 - 46.0 %   MCV 92.5 80.0 - 100.0 fL   MCH 29.7 26.0 - 34.0 pg   MCHC 32.1 30.0 - 36.0 g/dL   RDW 13.6 11.5 - 15.5 %   Platelets 197 150 - 400 K/uL   nRBC 0.0 0.0 - 0.2 %    Comment:  Performed at [facility], [place] [address] [place], [place] [address]  Differential     Status: None   Collection Time: 12/13/18  3:23 PM  Result Value Ref Range   Neutrophils Relative % 77 %   Neutro Abs 5.8 1.7 - 7.7 K/uL   Lymphocytes Relative 16 %   Lymphs Abs 1.2 0.7 - 4.0 K/uL   Monocytes Relative 7 %   Monocytes Absolute 0.5 0.1 - 1.0 K/uL   Eosinophils Relative 0 %   Eosinophils Absolute 0.0 0.0 - 0.5 K/uL   Basophils Relative 0 %   Basophils Absolute 0.0 0.0 - 0.1 K/uL   Immature Granulocytes 0 %   Abs Immature Granulocytes 0.02 0.00 - 0.07 K/uL    Comment: Performed at [facility], [place] [address], [place], [place] [address]  Comprehensive metabolic panel     Status: Abnormal   Collection Time: 12/13/18  3:23 PM  Result Value Ref Range   Sodium 135 135 - 145 mmol/L   Potassium 3.9 3.5 - 5.1 mmol/L   Chloride 102 98 - 111 mmol/L   CO2 25 22 - 32 mmol/L   Glucose, Bld 117 (H) 70 - 99 mg/dL   BUN 12 8 - 23 mg/dL   Creatinine, Ser 0.64 0.44 - 1.00 mg/dL   Calcium 9.2 8.9 - 10.3 mg/dL   Total Protein 6.9 6.5 - 8.1 g/dL   Albumin 3.5 3.5 - 5.0 g/dL   AST 24 15 - 41 U/L   ALT 14 0 - 44 U/L   Alkaline Phosphatase 59 38 - 126 U/L   Total Bilirubin 0.5 0.3 - 1.2 mg/dL   GFR calc non Af Amer >60 >60 mL/min   GFR calc Af Amer >60 >60 mL/min   Anion gap 8 5 - 15    Comment: Performed at [place] [address] [place], [place] [address]  I-stat chem 8, ED     Status: Abnormal   Collection Time: 12/13/18  3:40 PM  Result Value Ref Range   Sodium 134 (L) 135 - 145 mmol/L   Potassium 3.9 3.5 - 5.1 mmol/L   Chloride 101 98 - 111 mmol/L   BUN 16 8 - 23 mg/dL   Creatinine, Ser 0.60 0.44 - 1.00 mg/dL   Glucose, Bld 111 (H) 70 - 99 mg/dL   Calcium, Ion 1.21 1.15 -  1.40 mmol/L   TCO2 26 22 - 32 mmol/L   Hemoglobin 13.9 12.0 - 15.0 g/dL   HCT 41.0 36.0 - 46.0 %   Ct Head Wo Contrast  Result Date: 12/13/2018 CLINICAL DATA:  Altered level of  consciousness. Right-sided weakness. EXAM: CT HEAD WITHOUT CONTRAST TECHNIQUE: Contiguous axial images were obtained from the base of the skull through the vertex without intravenous contrast. COMPARISON:  CT head 12/30/2017 FINDINGS: Brain: Moderate atrophy. Moderate chronic white matter changes are stable from the prior study. Small chronic infarct left cerebellum unchanged. Negative for acute infarct, hemorrhage, or mass.  No midline shift. Vascular: Negative for hyperdense vessel Skull: Negative Sinuses/Orbits: Mucosal edema right maxillary sinus. Associated Peri dental lucency around right upper molars. No orbital lesion. Other: None IMPRESSION: No acute intracranial abnormality. Moderate atrophy and moderate chronic ischemic changes in the white matter appear stable from the prior CT. Electronically Signed   By: Franchot Gallo M.D.   On: 12/13/2018 16:59    Pending Labs Unresulted Labs (From admission, onward)    Start     Ordered   12/14/18 0500  Hemoglobin A1c  Tomorrow morning,   R     12/13/18 1806   12/14/18 0500  Lipid panel  Tomorrow morning,   R     12/13/18 1806   12/14/18 0500  TSH  Tomorrow morning,   R     12/13/18 1817   12/13/18 1804  Creatinine, serum  (enoxaparin (LOVENOX)    CrCl < 30 ml/min)  Once,   STAT    Comments: Baseline for enoxaparin therapy IF NOT ALREADY DRAWN.    12/13/18 1805   12/13/18 1523  Urine rapid drug screen (hosp performed)  ONCE - STAT,   STAT     12/13/18 1522   12/13/18 1523  Urinalysis, Routine w reflex microscopic  ONCE - STAT,   STAT     12/13/18 1522          Vitals/Pain Today's Vitals   12/13/18 1600 12/13/18 1745 12/13/18 1815 12/13/18 1828  BP: (!) 141/47 (!) 166/55 (!) 178/71   Pulse: 74   77  Resp: 18 15 (!) 22 17  Temp:      TempSrc:      SpO2: 97%   96%  Weight:      PainSc:        Isolation Precautions No active isolations  Medications Medications  enoxaparin (LOVENOX) injection 30 mg (has no administration in time  range)  0.9 %  sodium chloride infusion (has no administration in time range)  levothyroxine (SYNTHROID) tablet 75 mcg (has no administration in time range)  atorvastatin (LIPITOR) tablet 10 mg (has no administration in time range)  hydrALAZINE (APRESOLINE) injection 5 mg (has no administration in time range)    Mobility non-ambulatory High fall risk   Focused Assessments    R Recommendations: See Admitting Provider Note  Report given to:   Additional Notes:

## 2018-12-13 NOTE — ED Notes (Signed)
Two RNs attempted to In and Out cath pt. Unable to get urine from this patient.

## 2018-12-13 NOTE — Consult Note (Signed)
Referring Physician: Dr. Rodena Piety    Chief Complaint: Acute right sided weakness  HPI: Patricia Gilbert is an 85 y.o. female with a history of dementia who presented from [facility] for acute right sided weakness. LKN was unknown, but over the weekend she was reported as not acting right. Unclear at what point during the weekend the change occurred as patient was seen by her family this weekend and new deficit was not reported at that time. At the SNF on Wednesday, while the patient was at lunch she was noted to be using her nondominant left hand to eat.  Her right upper extremity appeared to be floppy.    Her PMHx includes HTN and hypothyroidism.    MRI brain revealed multifocal signal abnormalities on the diffusion weighted images, consistent with multifocal ischemic infarctions versus PRES. A 10 mm focus of linear enhancement within the right caudate head without mass effect or reduced diffusion is felt most likely to represent a late subacute infarction.   MRI brain with contrast: 1. Small areas of reduced diffusion compatible with acute/early subacute infarction within bilateral occipital lobes as well as numerous subcentimeter foci throughout cerebellum, left pons, bilateral frontal lobes, bilateral parietal lobes, and the left temporal lobe. Multiple vascular territories favors embolic etiology, concurrent PRES is considered less likely. No hemorrhage or mass effect. 2. 10 mm linear enhancement in right caudate head without mass effect or reduced diffusion, in the absence of primary malignancy this is probably a subacute infarction. Consider 3 month follow-up MRI of the brain with and without contrast to ensure stability. 3. Severe chronic microvascular ischemic changes and moderate volume loss of the brain.    Past Medical History:  Diagnosis Date  . Allergic rhinitis, cause unspecified   . Hypothyroid   . Melanoma ([place]) 1997  . Sinus tachycardia   . Syncope 08/20/2011    single event, neg echo and cards eval     Past Surgical History:  Procedure Laterality Date  . CESAREAN SECTION     [id number]  . HIP PINNING,CANNULATED Left 12/31/2017   Procedure: CANNULATED HIP PINNING;  Surgeon: Mcarthur Rossetti, MD;  Location: [facility];  Service: Orthopedics;  Laterality: Left;  Marland Kitchen MELANOMA EXCISION  1997    Family History  Problem Relation Age of Onset  . Prostate cancer Father   . Diabetes Other   . Stroke Other    Social History:  reports that she has never smoked. She has never used smokeless tobacco. She reports current alcohol use. She reports that she does not use drugs.  Allergies:  Allergies  Allergen Reactions  . Aspirin   . Codeine Palpitations    Medications:  Current Meds  Medication Sig  . acetaminophen (TYLENOL) 325 MG tablet Take 1-2 tablets (325-650 mg total) by mouth every 6 (six) hours as needed for mild pain (pain score 1-3 or temp > 100.5).  Marland Kitchen alendronate (FOSAMAX) 70 MG tablet Take 1 tablet (70 mg total) by mouth every 7 (seven) days. Take with a full glass of water on an empty stomach. (Patient taking differently: Take 70 mg by mouth every 7 (seven) days. Take with a full glass of water on an empty stomach. Thursday)  . docusate sodium (COLACE) 100 MG capsule Take 1 capsule (100 mg total) by mouth 2 (two) times daily. (Patient taking differently: Take 100 mg by mouth daily. )  . levothyroxine (SYNTHROID, LEVOTHROID) 50 MCG tablet Take 1 tablet (50 mcg total) by mouth daily. (Patient taking differently: Take  75 mcg by mouth daily. )  . losartan (COZAAR) 25 MG tablet Take 25 mg by mouth daily.  . meclizine (ANTIVERT) 12.5 MG tablet Take 12.5 mg by mouth every 4 (four) hours as needed for dizziness.  . senna (SENOKOT) 8.6 MG TABS tablet Take 1 tablet by mouth daily as needed for mild constipation.  Marland Kitchen zinc oxide 20 % ointment Apply 1 application topically as needed for irritation (groin).    ROS: Unable to obtain due to lack of  cooperation and dementia.   Physical Examination: Blood pressure (!) 168/62, pulse 80, temperature 98.6 F (37 C), temperature source Oral, resp. rate 20, weight 45.5 kg, SpO2 96 %.  HEENT: [place]/AT Lungs: Respirations unlabored Ext: No edema. Decreased muscle bulk diffusely. Pigmentary changes from chronic bruising noted.   Neurologic Examination: Mental Status: Awake and alert. Oriented to city and state, but not year, month or day. Paucity of speech. In the context of this, her short answers to questions are fluent. Intact comprehension for most commands, but poorly cooperative/abulic.  Cranial Nerves: II:  Tracks examiner visually. Fixates briefly on examiner. Unreliable assessment of visual fields due to poor cooperation. PERRL.  III,IV, VI: No ptosis. Will track to left and right briefly. No nystagmus.  V,VII: No facial droop. Can sense cool object bilaterally.  VIII: hearing intact to voice IX,X: No hypophonia XI: Symmetric shoulder shrug XII: midline tongue  Motor: RUE 4-/5 RLE 4+/5 LUE 5/5 LLE 4+/5 Poor cooperation limits sensitivity/specificity of motor exam Sensory: Poor attention limits exam. Decreased sensation to FT and pressure RUE. Decreased sensation to FT and pressure LLE.   Deep Tendon Reflexes:  Hypoactive throughout.  Plantars: Mute bilaterally.  Cerebellar: Unable/refuses to perform FNF on the right. No gross ataxia with FNF on the left.  Gait: Deferred  Results for orders placed or performed during the hospital encounter of 12/13/18 (from the past 48 hour(s))  Ethanol     Status: None   Collection Time: 12/13/18  3:23 PM  Result Value Ref Range   Alcohol, Ethyl (B) <10 <10 mg/dL    Comment: (NOTE) Lowest detectable limit for serum alcohol is 10 mg/dL. For medical purposes only. Performed at [facility], [place] [address] [place], [place] [address]   Protime-INR     Status: None   Collection Time: 12/13/18  3:23 PM  Result Value Ref Range    Prothrombin Time 13.9 11.4 - 15.2 seconds   INR 1.1 0.8 - 1.2    Comment: (NOTE) INR goal varies based on device and disease states. Performed at [facility], [place] [address], [place], [place] [address]   APTT     Status: Abnormal   Collection Time: 12/13/18  3:23 PM  Result Value Ref Range   aPTT 38 (H) 24 - 36 seconds    Comment:        IF BASELINE aPTT IS ELEVATED, SUGGEST PATIENT RISK ASSESSMENT BE USED TO DETERMINE APPROPRIATE ANTICOAGULANT THERAPY. Performed at [facility], [place] [address], [place] [address]   CBC     Status: None   Collection Time: 12/13/18  3:23 PM  Result Value Ref Range   WBC 7.5 4.0 - 10.5 K/uL   RBC 4.54 3.87 - 5.11 MIL/uL   Hemoglobin 13.5 12.0 - 15.0 g/dL   HCT 42.0 36.0 - 46.0 %   MCV 92.5 80.0 - 100.0 fL   MCH 29.7 26.0 - 34.0 pg   MCHC 32.1 30.0 - 36.0  g/dL   RDW 13.6 11.5 - 15.5 %   Platelets 197 150 - 400 K/uL   nRBC 0.0 0.0 - 0.2 %    Comment: Performed at [facility], [place] [address], [place], [place] [address]  Differential     Status: None   Collection Time: 12/13/18  3:23 PM  Result Value Ref Range   Neutrophils Relative % 77 %   Neutro Abs 5.8 1.7 - 7.7 K/uL   Lymphocytes Relative 16 %   Lymphs Abs 1.2 0.7 - 4.0 K/uL   Monocytes Relative 7 %   Monocytes Absolute 0.5 0.1 - 1.0 K/uL   Eosinophils Relative 0 %   Eosinophils Absolute 0.0 0.0 - 0.5 K/uL   Basophils Relative 0 %   Basophils Absolute 0.0 0.0 - 0.1 K/uL   Immature Granulocytes 0 %   Abs Immature Granulocytes 0.02 0.00 - 0.07 K/uL    Comment: Performed at [facility], [place] [address] [place], [place] [address]  Comprehensive metabolic panel     Status: Abnormal   Collection Time: 12/13/18  3:23 PM  Result Value Ref Range   Sodium 135 135 - 145 mmol/L   Potassium 3.9 3.5 - 5.1 mmol/L   Chloride 102 98 - 111 mmol/L   CO2 25 22 - 32 mmol/L   Glucose, Bld 117 (H) 70 - 99 mg/dL   BUN 12 8 - 23 mg/dL   Creatinine, Ser 0.64 0.44 -  1.00 mg/dL   Calcium 9.2 8.9 - 10.3 mg/dL   Total Protein 6.9 6.5 - 8.1 g/dL   Albumin 3.5 3.5 - 5.0 g/dL   AST 24 15 - 41 U/L   ALT 14 0 - 44 U/L   Alkaline Phosphatase 59 38 - 126 U/L   Total Bilirubin 0.5 0.3 - 1.2 mg/dL   GFR calc non Af Amer >60 >60 mL/min   GFR calc Af Amer >60 >60 mL/min   Anion gap 8 5 - 15    Comment: Performed at [facility] [address], [place], [place] [address]  I-stat chem 8, ED     Status: Abnormal   Collection Time: 12/13/18  3:40 PM  Result Value Ref Range   Sodium 134 (L) 135 - 145 mmol/L   Potassium 3.9 3.5 - 5.1 mmol/L   Chloride 101 98 - 111 mmol/L   BUN 16 8 - 23 mg/dL   Creatinine, Ser 0.60 0.44 - 1.00 mg/dL   Glucose, Bld 111 (H) 70 - 99 mg/dL   Calcium, Ion 1.21 1.15 - 1.40 mmol/L   TCO2 26 22 - 32 mmol/L   Hemoglobin 13.9 12.0 - 15.0 g/dL   HCT 41.0 36.0 - 46.0 %   Ct Head Wo Contrast  Result Date: 12/13/2018 CLINICAL DATA:  Altered level of consciousness. Right-sided weakness. EXAM: CT HEAD WITHOUT CONTRAST TECHNIQUE: Contiguous axial images were obtained from the base of the skull through the vertex without intravenous contrast. COMPARISON:  CT head 12/30/2017 FINDINGS: Brain: Moderate atrophy. Moderate chronic white matter changes are stable from the prior study. Small chronic infarct left cerebellum unchanged. Negative for acute infarct, hemorrhage, or mass.  No midline shift. Vascular: Negative for hyperdense vessel Skull: Negative Sinuses/Orbits: Mucosal edema right maxillary sinus. Associated Peri dental lucency around right upper molars. No orbital lesion. Other: None IMPRESSION: No acute intracranial abnormality. Moderate atrophy and moderate chronic ischemic changes in the white matter appear stable from the prior CT. Electronically Signed   By: Juanda Crumble  Carlis Abbott M.D.   On: 12/13/2018 16:59    Assessment: 85 y.o. female presenting with acute right sided weakness 1. MRI brain revealed multifocal signal abnormalities on the  diffusion weighted images, consistent with multifocal ischemic infarctions versus PRES. A 10 mm focus of linear enhancement within the right caudate head without mass effect or reduced diffusion is felt most likely to represent a late subacute infarction.  2. On personal review of the images from the MRI scan, the appearance of the multifocal lesions is as consistent with PRES as acute strokes. If stroke work up is more suggestive of PRES, then lesions may be reversible. This should be re-evaluated with repeat MRI brain in 1 month.  3. Stroke Risk Factors - HTN.  4. Aspirin allergy per Epic.   Plan: 1. HgbA1c, fasting lipid panel 2. MRA of the brain without contrast 3. PT consult, OT consult, Speech consult 4. Echocardiogram 5. Carotid dopplers 6. Prophylactic therapy- Start Plavix 75 mg po qd 7. Risk factor modification 8. Telemetry monitoring 9. Frequent neuro checks 10. Has been started on a low-dose statin.  11. BP management per standard protocol as she is out of the permissive HTN time window and her MRI lesions are suggestive of PRES as a potential alternate etiology for her presentation.    @Electronically  signed: Dr. Kerney Elbe 12/13/2018, 7:25 PM

## 2018-12-14 ENCOUNTER — Inpatient Hospital Stay (HOSPITAL_COMMUNITY): Payer: Medicare Other

## 2018-12-14 DIAGNOSIS — I6340 Cerebral infarction due to embolism of unspecified cerebral artery: Principal | ICD-10-CM

## 2018-12-14 LAB — URINALYSIS, ROUTINE W REFLEX MICROSCOPIC
Bilirubin Urine: NEGATIVE
Glucose, UA: NEGATIVE mg/dL
Ketones, ur: 20 mg/dL — AB
Nitrite: POSITIVE — AB
Protein, ur: NEGATIVE mg/dL
Specific Gravity, Urine: >1.046 — ABNORMAL HIGH (ref 1.005–1.030)
pH: 5 (ref 5.0–8.0)

## 2018-12-14 LAB — LIPID PANEL
Cholesterol: 185 mg/dL (ref 0–200)
HDL: 67 mg/dL (ref >40)
LDL Cholesterol: 106 mg/dL — ABNORMAL HIGH (ref 0–99)
Total CHOL/HDL Ratio: 2.8 RATIO
Triglycerides: 62 mg/dL (ref <150)
VLDL: 12 mg/dL (ref 0–40)

## 2018-12-14 LAB — TSH: TSH: 3.036 u[IU]/mL (ref 0.350–4.500)

## 2018-12-14 LAB — RAPID URINE DRUG SCREEN, HOSP PERFORMED
Amphetamines: NOT DETECTED
Barbiturates: NOT DETECTED
Benzodiazepines: NOT DETECTED
Cocaine: NOT DETECTED
Opiates: NOT DETECTED
Tetrahydrocannabinol: NOT DETECTED

## 2018-12-14 LAB — HEMOGLOBIN A1C
Hgb A1c MFr Bld: 5.2 % (ref 4.8–5.6)
Mean Plasma Glucose: 102.54 mg/dL

## 2018-12-14 LAB — MRSA PCR SCREENING: MRSA by PCR: NEGATIVE

## 2018-12-14 MED ORDER — CLOPIDOGREL BISULFATE 75 MG PO TABS
75.0000 mg | ORAL_TABLET | Freq: Every day | ORAL | Status: DC
  Administered 2018-12-14 – 2018-12-17 (×4): 75 mg via ORAL
  Filled 2018-12-14 (×4): qty 1

## 2018-12-14 MED ORDER — ATORVASTATIN CALCIUM 10 MG PO TABS
20.0000 mg | ORAL_TABLET | Freq: Every day | ORAL | Status: DC
  Administered 2018-12-14 – 2018-12-16 (×3): 20 mg via ORAL
  Filled 2018-12-14 (×3): qty 2

## 2018-12-14 NOTE — Plan of Care (Signed)
  Problem: Education: Goal: Knowledge of disease or condition will improve Outcome: Progressing Goal: Knowledge of secondary prevention will improve Outcome: Progressing Goal: Knowledge of patient specific risk factors addressed and post discharge goals established will improve Outcome: Progressing   

## 2018-12-14 NOTE — Evaluation (Signed)
Physical Therapy Evaluation Patient Details Name: Patricia Gilbert MRN: [id number] DOB: 20-Mar-1926 Today's Date: 12/14/2018   History of Present Illness  Pt is a 85 y/o female who presents from [facility] after staff noted she was eating with her non-dominant hand and RUE appeared flaccid. MRI revealed multifocal signal abnormalities on the diffusion weighted images, consistent with multifocal ischemic infarctions versus PRES (subacute).  Clinical Impression  Pt admitted with above diagnosis. Pt currently with functional limitations due to the deficits listed below (see PT Problem List). At the time of PT eval pt was able to perform transfers with up to +2 max assist for balance support and safety. Pt answering some questions but verbalizations were limited. Unsure of PLOF as pt not able to provide any history, however pt likely with a decline in function. She would benefit from continued rehab at the SNF level at d/c. Acutely, pt will benefit from skilled PT to increase their independence and safety with mobility to allow discharge to the venue listed below.       Follow Up Recommendations SNF;Supervision/Assistance - 24 hour    Equipment Recommendations  None recommended by PT    Recommendations for Other Services       Precautions / Restrictions Precautions Precautions: Fall Restrictions Weight Bearing Restrictions: No      Mobility  Bed Mobility Overal bed mobility: Needs Assistance Bed Mobility: Sidelying to Sit   Sidelying to sit: Max assist       General bed mobility comments: Assist for LE's off EOB and to elevate trunk to full sitting position. Hand-over-hand assist to reach for rails.   Transfers Overall transfer level: Needs assistance Equipment used: 2 person hand held assist Transfers: Sit to/from [employer] Sit to Stand: Max assist;+2 physical assistance   Squat pivot transfers: Max assist;+2 physical assistance     General transfer  comment: Assist for power-up to full stand as well as to facilitate squat pivot transfer to drop-arm recliner.   Ambulation/Gait             General Gait Details: Unable at this time.   Stairs            Wheelchair Mobility    Modified Rankin (Stroke Patients Only) Modified Rankin (Stroke Patients Only) Pre-Morbid Rankin Score: Moderately severe disability Modified Rankin: Severe disability     Balance Overall balance assessment: Needs assistance Sitting-balance support: Feet supported;Bilateral upper extremity supported Sitting balance-Leahy Scale: Poor     Standing balance support: Bilateral upper extremity supported;During functional activity Standing balance-Leahy Scale: Zero Standing balance comment: +2 assist                              Pertinent Vitals/Pain Pain Assessment: Faces Faces Pain Scale: No hurt Pain Intervention(s): Monitored during session    Home Living Family/patient expects to be discharged to:: Skilled nursing facility                      Prior Function Level of Independence: Needs assistance         Comments: Pt unable to provide a history. Likely required some assistance for ADL's however unsure of true PLOF.      Hand Dominance   Dominant Hand: Right    Extremity/Trunk Assessment   Upper Extremity Assessment Upper Extremity Assessment: Defer to OT evaluation    Lower Extremity Assessment Lower Extremity Assessment: RLE deficits/detail RLE Deficits / Details: Decreased  strength and AROM however difficult to assess 2 dementia. Grossly weak bilaterally.     Cervical / Trunk Assessment Cervical / Trunk Assessment: Kyphotic  Communication   Communication: No difficulties  Cognition Arousal/Alertness: Lethargic Behavior During Therapy: Flat affect Overall Cognitive Status: History of cognitive impairments - at baseline                     Current Attention Level: Sustained                   General Comments      Exercises     Assessment/Plan    PT Assessment Patient needs continued PT services  PT Problem List Decreased strength;Decreased range of motion;Decreased activity tolerance;Decreased balance;Decreased mobility;Decreased cognition;Decreased knowledge of use of DME;Decreased safety awareness;Decreased knowledge of precautions;Decreased coordination       PT Treatment Interventions DME instruction;Gait training;Functional mobility training;Therapeutic activities;Therapeutic exercise;Neuromuscular re-education;Balance training;Cognitive remediation;Patient/family education;Wheelchair mobility training    PT Goals (Current goals can be found in the Care Plan section)  Acute Rehab PT Goals Patient Stated Goal: Unable to state goals at this time.  PT Goal Formulation: With patient Time For Goal Achievement: 12/28/18 Potential to Achieve Goals: Fair    Frequency Min 3X/week   Barriers to discharge        Co-evaluation PT/OT/SLP Co-Evaluation/Treatment: Yes Reason for Co-Treatment: Complexity of the patient's impairments (multi-system involvement);Necessary to address cognition/behavior during functional activity;For patient/therapist safety;To address functional/ADL transfers PT goals addressed during session: Mobility/safety with mobility;Balance         AM-PAC PT "6 Clicks" Mobility  Outcome Measure Help needed turning from your back to your side while in a flat bed without using bedrails?: A Lot Help needed moving from lying on your back to sitting on the side of a flat bed without using bedrails?: A Lot Help needed moving to and from a bed to a chair (including a wheelchair)?: Total Help needed standing up from a chair using your arms (e.g., wheelchair or bedside chair)?: A Lot Help needed to walk in hospital room?: Total Help needed climbing 3-5 steps with a railing? : Total 6 Click Score: 9    End of Session Equipment Utilized During  Treatment: Gait belt Activity Tolerance: Patient limited by lethargy Patient left: in chair;with call bell/phone within reach;with chair alarm set Nurse Communication: Mobility status PT Visit Diagnosis: Unsteadiness on feet (R26.81);Other symptoms and signs involving the nervous system (R29.898);Hemiplegia and hemiparesis Hemiplegia - Right/Left: Right Hemiplegia - dominant/non-dominant: Dominant Hemiplegia - caused by: Cerebral infarction    Time: 7680-8811 PT Time Calculation (min) (ACUTE ONLY): 32 min   Charges:   PT Evaluation $PT Eval Moderate Complexity: 1 Mod          Rolinda Roan, PT, DPT Acute Rehabilitation Services Pager: [phone] Office: [phone]   Thelma Comp 12/14/2018, 1:35 PM

## 2018-12-14 NOTE — NC FL2 (Signed)
  [place] LEVEL OF CARE SCREENING TOOL     IDENTIFICATION  Patient Name: Patricia Gilbert Birthdate: January 04, 1926 Sex: female Admission Date (Current Location): 12/13/2018  [facility] and [place] Number:  Herbalist and Address:  [facility] [place] [address] [place], [place] [address]      Provider Number: [id number]  Attending Physician Name and Address:  Georgette Shell, MD  Relative Name and Phone Number:       Current Level of Care: Hospital Recommended Level of Care: [place] Prior Approval Number:    Date Approved/Denied:   PASRR Number: [id number]  Discharge Plan: SNF    Current Diagnoses: Patient Active Problem List   Diagnosis Date Noted  . CVA (cerebral vascular accident) ([place]) 12/13/2018  . HTN (hypertension) 01/02/2018  . Hip fracture ([place]) 12/31/2017  . Left displaced femoral neck fracture ([place]) 12/31/2017  . Wound of left leg 03/13/2015  . Osteoporosis, senile 01/09/2013  . Allergic rhinitis, cause unspecified   . Hypothyroid   . Mild dementia ([place])   . Syncope 08/20/2011  . Sinus tachycardia 08/20/2011    Orientation RESPIRATION BLADDER Height & Weight     Self  Normal Incontinent Weight: 99 lb 10.4 oz (45.2 kg) Height:  5\' 1"  (154.9 cm)  BEHAVIORAL SYMPTOMS/MOOD NEUROLOGICAL BOWEL NUTRITION STATUS      Continent Diet(see DC summary)  AMBULATORY STATUS COMMUNICATION OF NEEDS Skin   Extensive Assist Verbally Normal                       Personal Care Assistance Level of Assistance  Bathing, Dressing, Feeding Bathing Assistance: Maximum assistance Feeding assistance: Limited assistance Dressing Assistance: Maximum assistance     Functional Limitations Info  Sight, Hearing, Speech Sight Info: Adequate Hearing Info: Adequate Speech Info: Adequate    SPECIAL CARE FACTORS FREQUENCY  PT (By licensed PT), OT (By licensed OT), Speech therapy     PT Frequency: 5x/wk OT  Frequency: 5x/wk     Speech Therapy Frequency: 5x/wk      Contractures Contractures Info: Not present    Additional Factors Info  Code Status, Allergies Code Status Info: DNR Allergies Info: Aspirin, Codeine           Current Medications (12/14/2018):  This is the current hospital active medication list Current Facility-Administered Medications  Medication Dose Route Frequency Provider Last Rate Last Dose  . atorvastatin (LIPITOR) tablet 20 mg  20 mg Oral q1800 Rosalin Hawking, MD   20 mg at 12/14/18 1611  . clopidogrel (PLAVIX) tablet 75 mg  75 mg Oral Daily Rosalin Hawking, MD   75 mg at 12/14/18 1033  . enoxaparin (LOVENOX) injection 30 mg  30 mg Subcutaneous QHS Georgette Shell, MD   30 mg at 12/13/18 2302  . hydrALAZINE (APRESOLINE) injection 5 mg  5 mg Intravenous Q6H PRN Georgette Shell, MD      . levothyroxine (SYNTHROID) tablet 75 mcg  75 mcg Oral Daily Georgette Shell, MD   75 mcg at 12/14/18 0601     Discharge Medications: Please see discharge summary for a list of discharge medications.  Relevant Imaging Results:  Relevant Lab Results:   Additional Information SS#: [id number]  Geralynn Ochs, LCSW

## 2018-12-14 NOTE — Progress Notes (Signed)
STROKE TEAM PROGRESS NOTE   INTERVAL HISTORY Patient sitting in chair, eyes open, confused, disorientated still has right arm weakness.  MRI showed multifocal cardioembolic stroke pattern.  Vitals:   12/14/18 0320 12/14/18 0830 12/14/18 1245 12/14/18 1612  BP: (!) 149/49 (!) 157/48 (!) 138/45 (!) 129/50  Pulse: 84 83 72 79  Resp: 16 18 18 18   Temp: 98.5 F (36.9 C) 98.6 F (37 C) 97.9 F (36.6 C) 98.3 F (36.8 C)  TempSrc: Oral Oral Oral Oral  SpO2: 95% 97% 99% 95%  Weight:      Height:        CBC:  Recent Labs  Lab 12/13/18 1523 12/13/18 1540  WBC 7.5  --   NEUTROABS 5.8  --   HGB 13.5 13.9  HCT 42.0 41.0  MCV 92.5  --   PLT 197  --     Basic Metabolic Panel:  Recent Labs  Lab 12/13/18 1523 12/13/18 1540  NA 135 134*  K 3.9 3.9  CL 102 101  CO2 25  --   GLUCOSE 117* 111*  BUN 12 16  CREATININE 0.64 0.60  CALCIUM 9.2  --    Lipid Panel:     Component Value Date/Time   CHOL 185 12/14/2018 0525   TRIG 62 12/14/2018 0525   HDL 67 12/14/2018 0525   CHOLHDL 2.8 12/14/2018 0525   VLDL 12 12/14/2018 0525   LDLCALC 106 (H) 12/14/2018 0525   HgbA1c:  Lab Results  Component Value Date   HGBA1C 5.2 12/14/2018   Urine Drug Screen:     Component Value Date/Time   LABOPIA NONE DETECTED 12/14/2018 0152   COCAINSCRNUR NONE DETECTED 12/14/2018 0152   LABBENZ NONE DETECTED 12/14/2018 0152   AMPHETMU NONE DETECTED 12/14/2018 0152   THCU NONE DETECTED 12/14/2018 0152   LABBARB NONE DETECTED 12/14/2018 0152    Alcohol Level     Component Value Date/Time   ETH <10 12/13/2018 1523    IMAGING Ct Angio Head W Or Wo Contrast  Result Date: 12/13/2018 CLINICAL DATA:  85 y/o  F; right-sided weakness. EXAM: CT ANGIOGRAPHY HEAD AND NECK TECHNIQUE: Multidetector CT imaging of the head and neck was performed using the standard protocol during bolus administration of intravenous contrast. Multiplanar CT image reconstructions and MIPs were obtained to evaluate the  vascular anatomy. Carotid stenosis measurements (when applicable) are obtained utilizing NASCET criteria, using the distal internal carotid diameter as the denominator. CONTRAST:  161mL OMNIPAQUE IOHEXOL 350 MG/ML SOLN COMPARISON:  12/13/2018 CT head. FINDINGS: CTA NECK FINDINGS Aortic arch: Standard branching. Imaged portion shows no evidence of aneurysm or dissection. No significant stenosis of the major arch vessel origins. Aortic calcific atherosclerosis. Right carotid system: No evidence of dissection, stenosis (50% or greater) or occlusion. Left carotid system: No evidence of dissection, stenosis (50% or greater) or occlusion. Vertebral arteries: Codominant. No evidence of dissection, stenosis (50% or greater) or occlusion. Skeleton: No acute osseous abnormality. Mild cervical spondylosis. C2-3 minimal grade 1 retrolisthesis. Other neck: Nodule within the right tracheoesophageal groove abutting posterior aspect of right lobe of thyroid measuring 14 x 11 x 11 mm (AP x ML x CC series 5, image 132 and series 9, image 85). Upper chest: Biapical pleuroparenchymal scarring. Review of the MIP images confirms the above findings CTA HEAD FINDINGS Anterior circulation: No significant stenosis, proximal occlusion, aneurysm, or vascular malformation. Posterior circulation: No significant stenosis, proximal occlusion, aneurysm, or vascular malformation. Venous sinuses: As permitted by contrast timing, patent. Anatomic variants: Patent  anterior communicating artery. No posterior communicating arteries identified, likely hypoplastic or absent. Bilateral occipital lobe cortical hypodensities. Review of the MIP images confirms the above findings IMPRESSION: 1. Patent carotid and vertebral arteries. No dissection, aneurysm, or hemodynamically significant stenosis utilizing NASCET criteria. 2. Patent anterior and posterior intracranial circulation. No large vessel occlusion, aneurysm, or significant stenosis. 3. Bilateral  occipital lobe cortical hypodensities, possibly areas of recent infarction or PRES given distribution. This can be further characterized with MRI. 4. Right tracheoesophageal groove nodule measuring 14 mm abutting posterior aspect of right lobe of thyroid. Findings may represent a thyroid nodule or parathyroid adenoma. Thyroid ultrasound is recommended on a nonemergent basis. These results were called by telephone at the time of interpretation on 12/13/2018 at 8:05 pm to Dr. Zenia Resides, who verbally acknowledged these results. Electronically Signed   By: Kristine Garbe M.D.   On: 12/13/2018 20:07   Dg Chest 1 View  Result Date: 12/13/2018 CLINICAL DATA:  Shortness of breath. Right-sided weakness. EXAM: CHEST  1 VIEW COMPARISON:  Radiograph and CT 12/30/2017 FINDINGS: Lower lung volumes from prior exam. Unchanged heart size and mediastinal contours allowing for differences in technique and lower lung volumes. No acute airspace disease, pulmonary edema, pleural fluid or pneumothorax. Calcified granuloma in the right lung. The bones are under mineralized. Excreted IV contrast in both renal collecting systems from prior head and neck CTA. IMPRESSION: Low lung volumes without acute abnormality. Electronically Signed   By: Keith Rake M.D.   On: 12/13/2018 20:25   Ct Head Wo Contrast  Result Date: 12/13/2018 CLINICAL DATA:  Altered level of consciousness. Right-sided weakness. EXAM: CT HEAD WITHOUT CONTRAST TECHNIQUE: Contiguous axial images were obtained from the base of the skull through the vertex without intravenous contrast. COMPARISON:  CT head 12/30/2017 FINDINGS: Brain: Moderate atrophy. Moderate chronic white matter changes are stable from the prior study. Small chronic infarct left cerebellum unchanged. Negative for acute infarct, hemorrhage, or mass.  No midline shift. Vascular: Negative for hyperdense vessel Skull: Negative Sinuses/Orbits: Mucosal edema right maxillary sinus. Associated Peri  dental lucency around right upper molars. No orbital lesion. Other: None IMPRESSION: No acute intracranial abnormality. Moderate atrophy and moderate chronic ischemic changes in the white matter appear stable from the prior CT. Electronically Signed   By: Franchot Gallo M.D.   On: 12/13/2018 16:59   Ct Angio Neck W Or Wo Contrast  Result Date: 12/13/2018 CLINICAL DATA:  85 y/o  F; right-sided weakness. EXAM: CT ANGIOGRAPHY HEAD AND NECK TECHNIQUE: Multidetector CT imaging of the head and neck was performed using the standard protocol during bolus administration of intravenous contrast. Multiplanar CT image reconstructions and MIPs were obtained to evaluate the vascular anatomy. Carotid stenosis measurements (when applicable) are obtained utilizing NASCET criteria, using the distal internal carotid diameter as the denominator. CONTRAST:  174mL OMNIPAQUE IOHEXOL 350 MG/ML SOLN COMPARISON:  12/13/2018 CT head. FINDINGS: CTA NECK FINDINGS Aortic arch: Standard branching. Imaged portion shows no evidence of aneurysm or dissection. No significant stenosis of the major arch vessel origins. Aortic calcific atherosclerosis. Right carotid system: No evidence of dissection, stenosis (50% or greater) or occlusion. Left carotid system: No evidence of dissection, stenosis (50% or greater) or occlusion. Vertebral arteries: Codominant. No evidence of dissection, stenosis (50% or greater) or occlusion. Skeleton: No acute osseous abnormality. Mild cervical spondylosis. C2-3 minimal grade 1 retrolisthesis. Other neck: Nodule within the right tracheoesophageal groove abutting posterior aspect of right lobe of thyroid measuring 14 x 11 x 11 mm (AP  x ML x CC series 5, image 132 and series 9, image 85). Upper chest: Biapical pleuroparenchymal scarring. Review of the MIP images confirms the above findings CTA HEAD FINDINGS Anterior circulation: No significant stenosis, proximal occlusion, aneurysm, or vascular malformation. Posterior  circulation: No significant stenosis, proximal occlusion, aneurysm, or vascular malformation. Venous sinuses: As permitted by contrast timing, patent. Anatomic variants: Patent anterior communicating artery. No posterior communicating arteries identified, likely hypoplastic or absent. Bilateral occipital lobe cortical hypodensities. Review of the MIP images confirms the above findings IMPRESSION: 1. Patent carotid and vertebral arteries. No dissection, aneurysm, or hemodynamically significant stenosis utilizing NASCET criteria. 2. Patent anterior and posterior intracranial circulation. No large vessel occlusion, aneurysm, or significant stenosis. 3. Bilateral occipital lobe cortical hypodensities, possibly areas of recent infarction or PRES given distribution. This can be further characterized with MRI. 4. Right tracheoesophageal groove nodule measuring 14 mm abutting posterior aspect of right lobe of thyroid. Findings may represent a thyroid nodule or parathyroid adenoma. Thyroid ultrasound is recommended on a nonemergent basis. These results were called by telephone at the time of interpretation on 12/13/2018 at 8:05 pm to Dr. Zenia Resides, who verbally acknowledged these results. Electronically Signed   By: Kristine Garbe M.D.   On: 12/13/2018 20:07   Mr Jeri Cos JQ Contrast  Result Date: 12/13/2018 CLINICAL DATA:  85 y/o  F; right-sided weakness.  Stroke evaluation. EXAM: MRI HEAD WITHOUT AND WITH CONTRAST TECHNIQUE: Multiplanar, multiecho pulse sequences of the brain and surrounding structures were obtained without and with intravenous contrast. CONTRAST:  5 cc Gadavist. COMPARISON:  12/13/2018 and 12/30/2017 CT head FINDINGS: Brain: Motion degradation of multiple sequences. Small (approximately 2 cm) areas of reduced diffusion within the bilateral occipital lobes corresponding to hypodensities on CT. Additionally there are numerous subcentimeter foci of reduced diffusion throughout the right greater than  left cerebellar hemispheres, left hemi pons as well as scattered throughout bilateral frontal, bilateral parietal, in the left temporal lobe. Findings are compatible with acute/early subacute infarction. Multiple vascular territories favors embolic etiology. Concurrent PRES is considered less likely. Large confluent nonspecific T2 FLAIR hyperintensities in subcortical and periventricular white matter as well as the pons are compatible with severe chronic microvascular ischemic changes in similar in comparison with white matter hypodensities on prior CTs of the head. Moderate volume loss of the brain. After administration of intravenous contrast there is a linear 10 mm focus of enhancement within the right caudate head with associated T2 hyperintensity in without reduced diffusion or mass effect, series 11, image 30. No additional abnormal intracranial enhancement identified. Vascular: Normal flow voids. Skull and upper cervical spine: Normal marrow signal. Sinuses/Orbits: Moderate my maxillary sinus mucosal thickening with small fluid level. Normal aeration of mastoid air cells. Orbits are unremarkable. Other: None. IMPRESSION: Motion degraded study. 1. Small areas of reduced diffusion compatible with acute/early subacute infarction within bilateral occipital lobes as well as numerous subcentimeter foci throughout cerebellum, left pons, bilateral frontal lobes, bilateral parietal lobes, and the left temporal lobe. Multiple vascular territories favors embolic etiology, concurrent PRES is considered less likely. No hemorrhage or mass effect. 2. 10 mm linear enhancement in right caudate head without mass effect or reduced diffusion, in the absence of primary malignancy this is probably a subacute infarction. Consider 3 month follow-up MRI of the brain with and without contrast to ensure stability. 3. Severe chronic microvascular ischemic changes and moderate volume loss of the brain. These results will be called to the  ordering clinician or representative by the Radiologist  Assistant, and communication documented in the PACS or zVision Dashboard. Electronically Signed   By: Kristine Garbe M.D.   On: 12/13/2018 21:18    PHYSICAL EXAM  Temp:  [97.9 F (36.6 C)-98.6 F (37 C)] 98.3 F (36.8 C) (06/18 1612) Pulse Rate:  [72-98] 79 (06/18 1612) Resp:  [16-20] 18 (06/18 1612) BP: (129-184)/(45-62) 129/50 (06/18 1612) SpO2:  [95 %-99 %] 95 % (06/18 1612) Weight:  [45.2 kg] 45.2 kg (06/17 2223)  General - Well nourished, well developed, lethargic, flat affect.  Ophthalmologic - fundi not visualized due to noncooperation.  Cardiovascular - Regular rate and rhythm.  Neuro - eyes open, following needed verbal commands.  Able to tell me her name, however not orientated to age place or time.  Able to repeat simple sentences, naming 2/4.  Blinking to visual threat bilaterally, PERRL, no gaze deviation.  Left facial droop.  Tongue midline.  Right upper extremity 0/5, flaccid.  Left upper extremity 3/5.  Bilateral lower extremity 3-/5.  Left unsustained ankle clonus.  Babinski negative.  Sensation subjectively symmetrical, finger-to-nose not corporative.  Gait not tested   ASSESSMENT/PLAN Patricia Gilbert is a 85 y.o. female with history of dementia presenting from SNF with R side weakness.   Stroke:   Multiple bilateral small anterior and posterior circulation infarcts, cardio embolic pattern, secondary to unknown source, suspicious for occult PAF  CT head No acute stroke. Small vessel disease. Moderate atrophy.     CTA head & neck no LVO.  Left P3 occlusion otherwise patent vessels. B occipital hypodensities.   MRI  B occipital lobe ischemia alone with mult small B cerebellar, L pons, B frontal lobes, B parietal lobes and L temporal lobe infarcts. R caudate head w/ linear enhancement in the absence of malignancy felt to be a  Subacute infarct.   LE Doppler  pending   2D Echo pending   Given  advanced age and dementia, recommend 30 day monitor at d/c to look for AF as possible source of stroke  LDL 106  HgbA1c 5.2  Lovenox 30 mg sq daily for VTE prophylaxis  No antithrombotic prior to admission, now on clopidogrel 75 mg daily stroke prevention. Patient is allergic to aspirin   Therapy recommendations:  SNF  Disposition:  Return to SNF  Hypertension  Stable . Permissive hypertension (OK if < 220/120) but gradually normalize in 2-3 days . Long-term BP goal normotensive  Hyperlipidemia  Home meds:  No statin  Now on Lipitor 20  LDL 106, goal < 70  Continue statin at discharge  Other Stroke Risk Factors  Advanced age  ETOH use, advised to drink no more than 1 drink(s) a day  Family hx stroke (other)  Other Active Problems  Hypothyroid, on synthroid, TSH normal  Dementia, nursing home resident  CTA showed R tracheoesophageal  grove nodule - recommend thyroid US to further evaluate  Hospital day # 1  Rosalin Hawking, MD PhD Stroke Neurology 12/14/2018 6:24 PM    To contact Stroke Continuity provider, please refer to [email or web address] After hours, contact General Neurology

## 2018-12-14 NOTE — Progress Notes (Addendum)
PROGRESS NOTE    PATCHES MCDONNELL  FYB:[id number] DOB: 1925/07/29 DOA: 12/13/2018 PCP: Patient, No Pcp Per    Brief Narrative: 85 y.o. female with medical history significant of hypertension hypothyroidism who was sent in from the nursing home as a staff noticed right-sided weakness while at lunch.  Apparently she was seen by her family this weekend and she was at her baseline.  She has a history of dementia.  She is able to speak and answer some of the questions.  She thinks she is at the [facility] she has 2 children.  She denies any headache nausea vomiting diarrhea chest pain shortness of breath cough fever chills abdominal pain.  ED Course: Sodium 134 potassium 3.9 BUN 16 creatinine 0.60 hemoglobin 13.9 platelet count 197 white count 7.5.  CT of the head shows chronic ischemic changes and moderate atrophy.  ED physician discussed with Dr. Malen Gauze and recommended stroke work-up. Review of Systems: As per HPI otherwise all other systems reviewed and are negative   Assessment & Plan:   Active Problems:   CVA (cerebral vascular accident) ([place])  #1embolic  CVA  with right-sided weakness-patient admitted with right upper and lower extremity weakness witnessed by the staff at the nursing home.  CT scan of the head does not show any acute finding.  MRI of the brain shows acute to subacute infarction within the bilateral occipital lobes as well as numerous subcentimeter foci throughout the cerebellum left pons bilateral frontal lobes bilateral parietal lobes and the left temporal lobe.  Multiple vascular territories favor embolic etiology.  No hemorrhage or mass-effect.  10 mm enhancement in the right caudate head possibly subacute infarction.  Severe chronic microvascular ischemic changes and moderate volume loss of the brain.  Hemoglobin A1c is 5.2 LDL is 106 TSH is 3.03.  Echocardiogram and carotid ultrasound are pending.   CT angiogram of the head and neck shows patent carotid and vertebral arteries  patent anterior and posterior intracranial circulation.  Bilateral occipital lobe cortical hypodensity possibly areas of recent infarction or press given distribution .continue statin and plavix ..allergic to asa. Patient needs ongoing stroke work up..still pending.PT/OT/SPEECH/ECHO PENDING  Essential hypertension patient is on Cozaar at home which will be held allow permissive hypertension.  Blood pressure here is 166/55.  Hypothyroidism continue Synthroid check TSH   Estimated body mass index is 18.83 kg/m as calculated from the following:   Height as of this encounter: 5\' 1"  (1.549 m).   Weight as of this encounter: 45.2 kg.    Subjective: [place]   Objective: Vitals:   12/13/18 2320 12/14/18 0120 12/14/18 0320 12/14/18 0830  BP: (!) 148/50 (!) 145/62 (!) 149/49 (!) 157/48  Pulse: 91 98 84 83  Resp: 16 18 16 18   Temp: 98.6 F (37 C) 98.3 F (36.8 C) 98.5 F (36.9 C) 98.6 F (37 C)  TempSrc: Oral Oral Oral Oral  SpO2: 96% 96% 95% 97%  Weight:      Height:        Intake/Output Summary (Last 24 hours) at 12/14/2018 1115 Last data filed at 12/14/2018 0800 Gross per 24 hour  Intake 390 ml  Output 550 ml  Net -160 ml   Filed Weights   12/13/18 1513 12/13/18 2223  Weight: 45.5 kg 45.2 kg    Examination: Chronically sick elderly female  General exam: Appears calm and comfortable  Respiratory system: Clear to auscultation. Respiratory effort normal. Cardiovascular system: S1 & S2 heard, RRR. No JVD,  murmurs, rubs, gallops or clicks. No pedal edema. Gastrointestinal system: Abdomen is nondistended, soft and nontender. No organomegaly or masses felt. Normal bowel sounds heard. Central nervous system:RUE RLE 1 TO 2 /5  ExtremitiesNO EDEMA Skin: No rashes, lesions or ulcers    Data Reviewed: I have personally reviewed following labs and imaging studies  CBC: Recent Labs  Lab 12/13/18 1523 12/13/18 1540  WBC 7.5  --   NEUTROABS 5.8  --     HGB 13.5 13.9  HCT 42.0 41.0  MCV 92.5  --   PLT 197  --    Basic Metabolic Panel: Recent Labs  Lab 12/13/18 1523 12/13/18 1540  NA 135 134*  K 3.9 3.9  CL 102 101  CO2 25  --   GLUCOSE 117* 111*  BUN 12 16  CREATININE 0.64 0.60  CALCIUM 9.2  --    GFR: Estimated Creatinine Clearance: 33.4 mL/min (by C-G formula based on SCr of 0.6 mg/dL). Liver Function Tests: Recent Labs  Lab 12/13/18 1523  AST 24  ALT 14  ALKPHOS 59  BILITOT 0.5  PROT 6.9  ALBUMIN 3.5   No results for input(s): LIPASE, AMYLASE in the last 168 hours. No results for input(s): AMMONIA in the last 168 hours. Coagulation Profile: Recent Labs  Lab 12/13/18 1523  INR 1.1   Cardiac Enzymes: No results for input(s): CKTOTAL, CKMB, CKMBINDEX, TROPONINI in the last 168 hours. BNP (last 3 results) No results for input(s): PROBNP in the last 8760 hours. HbA1C: Recent Labs    12/14/18 0525  HGBA1C 5.2   CBG: No results for input(s): GLUCAP in the last 168 hours. Lipid Profile: Recent Labs    12/14/18 0525  CHOL 185  HDL 67  LDLCALC 106*  TRIG 62  CHOLHDL 2.8   Thyroid Function Tests: Recent Labs    12/14/18 0525  TSH 3.036   Anemia Panel: No results for input(s): VITAMINB12, FOLATE, FERRITIN, TIBC, IRON, RETICCTPCT in the last 72 hours. Sepsis Labs: No results for input(s): PROCALCITON, LATICACIDVEN in the last 168 hours.  Recent Results (from the past 240 hour(s))  MRSA PCR Screening     Status: None   Collection Time: 12/14/18  1:40 AM   Specimen: Nasal Mucosa; Nasopharyngeal  Result Value Ref Range Status   MRSA by PCR NEGATIVE NEGATIVE Final    Comment:        The GeneXpert MRSA Assay (FDA approved for NASAL specimens only), is one component of a comprehensive MRSA colonization surveillance program. It is not intended to diagnose MRSA infection nor to guide or monitor treatment for MRSA infections. Performed at [facility], [place] [address], [place], [place]  [address]          Radiology Studies: Ct Angio Head W Or Wo Contrast  Result Date: 12/13/2018 CLINICAL DATA:  85 y/o  F; right-sided weakness. EXAM: CT ANGIOGRAPHY HEAD AND NECK TECHNIQUE: Multidetector CT imaging of the head and neck was performed using the standard protocol during bolus administration of intravenous contrast. Multiplanar CT image reconstructions and MIPs were obtained to evaluate the vascular anatomy. Carotid stenosis measurements (when applicable) are obtained utilizing NASCET criteria, using the distal internal carotid diameter as the denominator. CONTRAST:  148mL OMNIPAQUE IOHEXOL 350 MG/ML SOLN COMPARISON:  12/13/2018 CT head. FINDINGS: CTA NECK FINDINGS Aortic arch: Standard branching. Imaged portion shows no evidence of aneurysm or dissection. No significant stenosis of the major arch vessel origins. Aortic calcific atherosclerosis. Right carotid system: No evidence of dissection,  stenosis (50% or greater) or occlusion. Left carotid system: No evidence of dissection, stenosis (50% or greater) or occlusion. Vertebral arteries: Codominant. No evidence of dissection, stenosis (50% or greater) or occlusion. Skeleton: No acute osseous abnormality. Mild cervical spondylosis. C2-3 minimal grade 1 retrolisthesis. Other neck: Nodule within the right tracheoesophageal groove abutting posterior aspect of right lobe of thyroid measuring 14 x 11 x 11 mm (AP x ML x CC series 5, image 132 and series 9, image 85). Upper chest: Biapical pleuroparenchymal scarring. Review of the MIP images confirms the above findings CTA HEAD FINDINGS Anterior circulation: No significant stenosis, proximal occlusion, aneurysm, or vascular malformation. Posterior circulation: No significant stenosis, proximal occlusion, aneurysm, or vascular malformation. Venous sinuses: As permitted by contrast timing, patent. Anatomic variants: Patent anterior communicating artery. No posterior communicating arteries identified,  likely hypoplastic or absent. Bilateral occipital lobe cortical hypodensities. Review of the MIP images confirms the above findings IMPRESSION: 1. Patent carotid and vertebral arteries. No dissection, aneurysm, or hemodynamically significant stenosis utilizing NASCET criteria. 2. Patent anterior and posterior intracranial circulation. No large vessel occlusion, aneurysm, or significant stenosis. 3. Bilateral occipital lobe cortical hypodensities, possibly areas of recent infarction or PRES given distribution. This can be further characterized with MRI. 4. Right tracheoesophageal groove nodule measuring 14 mm abutting posterior aspect of right lobe of thyroid. Findings may represent a thyroid nodule or parathyroid adenoma. Thyroid ultrasound is recommended on a nonemergent basis. These results were called by telephone at the time of interpretation on 12/13/2018 at 8:05 pm to Dr. Zenia Resides, who verbally acknowledged these results. Electronically Signed   By: Kristine Garbe M.D.   On: 12/13/2018 20:07   Dg Chest 1 View  Result Date: 12/13/2018 CLINICAL DATA:  Shortness of breath. Right-sided weakness. EXAM: CHEST  1 VIEW COMPARISON:  Radiograph and CT 12/30/2017 FINDINGS: Lower lung volumes from prior exam. Unchanged heart size and mediastinal contours allowing for differences in technique and lower lung volumes. No acute airspace disease, pulmonary edema, pleural fluid or pneumothorax. Calcified granuloma in the right lung. The bones are under mineralized. Excreted IV contrast in both renal collecting systems from prior head and neck CTA. IMPRESSION: Low lung volumes without acute abnormality. Electronically Signed   By: Keith Rake M.D.   On: 12/13/2018 20:25   Ct Head Wo Contrast  Result Date: 12/13/2018 CLINICAL DATA:  Altered level of consciousness. Right-sided weakness. EXAM: CT HEAD WITHOUT CONTRAST TECHNIQUE: Contiguous axial images were obtained from the base of the skull through the vertex  without intravenous contrast. COMPARISON:  CT head 12/30/2017 FINDINGS: Brain: Moderate atrophy. Moderate chronic white matter changes are stable from the prior study. Small chronic infarct left cerebellum unchanged. Negative for acute infarct, hemorrhage, or mass.  No midline shift. Vascular: Negative for hyperdense vessel Skull: Negative Sinuses/Orbits: Mucosal edema right maxillary sinus. Associated Peri dental lucency around right upper molars. No orbital lesion. Other: None IMPRESSION: No acute intracranial abnormality. Moderate atrophy and moderate chronic ischemic changes in the white matter appear stable from the prior CT. Electronically Signed   By: Franchot Gallo M.D.   On: 12/13/2018 16:59   Ct Angio Neck W Or Wo Contrast  Result Date: 12/13/2018 CLINICAL DATA:  85 y/o  F; right-sided weakness. EXAM: CT ANGIOGRAPHY HEAD AND NECK TECHNIQUE: Multidetector CT imaging of the head and neck was performed using the standard protocol during bolus administration of intravenous contrast. Multiplanar CT image reconstructions and MIPs were obtained to evaluate the vascular anatomy. Carotid stenosis measurements (when applicable)  are obtained utilizing NASCET criteria, using the distal internal carotid diameter as the denominator. CONTRAST:  16mL OMNIPAQUE IOHEXOL 350 MG/ML SOLN COMPARISON:  12/13/2018 CT head. FINDINGS: CTA NECK FINDINGS Aortic arch: Standard branching. Imaged portion shows no evidence of aneurysm or dissection. No significant stenosis of the major arch vessel origins. Aortic calcific atherosclerosis. Right carotid system: No evidence of dissection, stenosis (50% or greater) or occlusion. Left carotid system: No evidence of dissection, stenosis (50% or greater) or occlusion. Vertebral arteries: Codominant. No evidence of dissection, stenosis (50% or greater) or occlusion. Skeleton: No acute osseous abnormality. Mild cervical spondylosis. C2-3 minimal grade 1 retrolisthesis. Other neck: Nodule  within the right tracheoesophageal groove abutting posterior aspect of right lobe of thyroid measuring 14 x 11 x 11 mm (AP x ML x CC series 5, image 132 and series 9, image 85). Upper chest: Biapical pleuroparenchymal scarring. Review of the MIP images confirms the above findings CTA HEAD FINDINGS Anterior circulation: No significant stenosis, proximal occlusion, aneurysm, or vascular malformation. Posterior circulation: No significant stenosis, proximal occlusion, aneurysm, or vascular malformation. Venous sinuses: As permitted by contrast timing, patent. Anatomic variants: Patent anterior communicating artery. No posterior communicating arteries identified, likely hypoplastic or absent. Bilateral occipital lobe cortical hypodensities. Review of the MIP images confirms the above findings IMPRESSION: 1. Patent carotid and vertebral arteries. No dissection, aneurysm, or hemodynamically significant stenosis utilizing NASCET criteria. 2. Patent anterior and posterior intracranial circulation. No large vessel occlusion, aneurysm, or significant stenosis. 3. Bilateral occipital lobe cortical hypodensities, possibly areas of recent infarction or PRES given distribution. This can be further characterized with MRI. 4. Right tracheoesophageal groove nodule measuring 14 mm abutting posterior aspect of right lobe of thyroid. Findings may represent a thyroid nodule or parathyroid adenoma. Thyroid ultrasound is recommended on a nonemergent basis. These results were called by telephone at the time of interpretation on 12/13/2018 at 8:05 pm to Dr. Zenia Resides, who verbally acknowledged these results. Electronically Signed   By: Kristine Garbe M.D.   On: 12/13/2018 20:07   Mr Jeri Cos UJ Contrast  Result Date: 12/13/2018 CLINICAL DATA:  85 y/o  F; right-sided weakness.  Stroke evaluation. EXAM: MRI HEAD WITHOUT AND WITH CONTRAST TECHNIQUE: Multiplanar, multiecho pulse sequences of the brain and surrounding structures were  obtained without and with intravenous contrast. CONTRAST:  5 cc Gadavist. COMPARISON:  12/13/2018 and 12/30/2017 CT head FINDINGS: Brain: Motion degradation of multiple sequences. Small (approximately 2 cm) areas of reduced diffusion within the bilateral occipital lobes corresponding to hypodensities on CT. Additionally there are numerous subcentimeter foci of reduced diffusion throughout the right greater than left cerebellar hemispheres, left hemi pons as well as scattered throughout bilateral frontal, bilateral parietal, in the left temporal lobe. Findings are compatible with acute/early subacute infarction. Multiple vascular territories favors embolic etiology. Concurrent PRES is considered less likely. Large confluent nonspecific T2 FLAIR hyperintensities in subcortical and periventricular white matter as well as the pons are compatible with severe chronic microvascular ischemic changes in similar in comparison with white matter hypodensities on prior CTs of the head. Moderate volume loss of the brain. After administration of intravenous contrast there is a linear 10 mm focus of enhancement within the right caudate head with associated T2 hyperintensity in without reduced diffusion or mass effect, series 11, image 30. No additional abnormal intracranial enhancement identified. Vascular: Normal flow voids. Skull and upper cervical spine: Normal marrow signal. Sinuses/Orbits: Moderate my maxillary sinus mucosal thickening with small fluid level. Normal aeration of mastoid air cells.  Orbits are unremarkable. Other: None. IMPRESSION: Motion degraded study. 1. Small areas of reduced diffusion compatible with acute/early subacute infarction within bilateral occipital lobes as well as numerous subcentimeter foci throughout cerebellum, left pons, bilateral frontal lobes, bilateral parietal lobes, and the left temporal lobe. Multiple vascular territories favors embolic etiology, concurrent PRES is considered less  likely. No hemorrhage or mass effect. 2. 10 mm linear enhancement in right caudate head without mass effect or reduced diffusion, in the absence of primary malignancy this is probably a subacute infarction. Consider 3 month follow-up MRI of the brain with and without contrast to ensure stability. 3. Severe chronic microvascular ischemic changes and moderate volume loss of the brain. These results will be called to the ordering clinician or representative by the Radiologist Assistant, and communication documented in the PACS or zVision Dashboard. Electronically Signed   By: Kristine Garbe M.D.   On: 12/13/2018 21:18        Scheduled Meds:  atorvastatin  20 mg Oral q1800   clopidogrel  75 mg Oral Daily   enoxaparin (LOVENOX) injection  30 mg Subcutaneous QHS   levothyroxine  75 mcg Oral Daily   Continuous Infusions:   LOS: 1 day     Georgette Shell, MD [facility]  If 7PM-7AM, please contact night-coverage [email or web address] Password [facility] 12/14/2018, 11:15 AM

## 2018-12-14 NOTE — Evaluation (Signed)
Occupational Therapy Evaluation Patient Details Name: Patricia Gilbert MRN: [id number] DOB: June 05, 1926 Today's Date: 12/14/2018    History of Present Illness Pt is a 85 y/o female who presents from [facility] after staff noted she was eating with her non-dominant hand and RUE appeared flaccid. MRI revealed multifocal signal abnormalities on the diffusion weighted images, consistent with multifocal ischemic infarctions versus PRES (subacute).   Clinical Impression   Pt JAS:NKNLZJ in ALF/SNF with assist from staff. Pt currently presents with RUE weakness and poor coordination. OTR attempting hand over hand techniques for grooming and pt unable to perform with RUE. LUE WFL for movements. Pt performing maxA for bed mobility and maxA+2 for squat pivot transfers. Pt with 2/5 MM grade in RUE with movement in gravity eliminated, but pt unable to perform tasks without maximal cues.  ADLs overall with maxA to [place]. Pt would benefit from continued OT skilled services for ADL, mobility and movement of RUE. OT following.       Follow Up Recommendations  SNF;Supervision/Assistance - 24 hour    Equipment Recommendations  None recommended by OT    Recommendations for Other Services       Precautions / Restrictions Precautions Precautions: Fall Restrictions Weight Bearing Restrictions: No      Mobility Bed Mobility Overal bed mobility: Needs Assistance Bed Mobility: Sidelying to Sit   Sidelying to sit: Max assist       General bed mobility comments: Assist for LE's off EOB and to elevate trunk to full sitting position. Hand-over-hand assist to reach for rails.   Transfers Overall transfer level: Needs assistance Equipment used: 2 person hand held assist Transfers: Sit to/from [employer] Sit to Stand: Max assist;+2 physical assistance   Squat pivot transfers: Max assist;+2 physical assistance     General transfer comment: Assist for power-up to full stand  as well as to facilitate squat pivot transfer to drop-arm recliner.     Balance Overall balance assessment: Needs assistance Sitting-balance support: Feet supported;Bilateral upper extremity supported Sitting balance-Leahy Scale: Poor     Standing balance support: Bilateral upper extremity supported;During functional activity Standing balance-Leahy Scale: Zero Standing balance comment: +2 assist                            ADL either performed or assessed with clinical judgement   ADL Overall ADL's : Needs assistance/impaired Eating/Feeding: Total assistance   Grooming: Maximal assistance;Sitting   Upper Body Bathing: Maximal assistance;Total assistance   Lower Body Bathing: Total assistance   Upper Body Dressing : Maximal assistance;Total assistance   Lower Body Dressing: Total assistance   Toilet Transfer: Maximal assistance;+2 for physical assistance;+2 for safety/equipment;BSC;Requires drop arm   Toileting- Clothing Manipulation and Hygiene: Maximal assistance;Sitting/lateral lean;Sit to/from stand       Functional mobility during ADLs: Total assistance General ADL Comments: Pt with hand over hadn technique- pt able to use LUE for grooming at EOB     Vision Baseline Vision/History: No visual deficits Patient Visual Report: No change from baseline Vision Assessment?: No apparent visual deficits     Perception     Praxis      Pertinent Vitals/Pain Pain Assessment: Faces Faces Pain Scale: No hurt Pain Intervention(s): Monitored during session     Hand Dominance Right   Extremity/Trunk Assessment Upper Extremity Assessment Upper Extremity Assessment: Defer to OT evaluation;Generalized weakness   Lower Extremity Assessment Lower Extremity Assessment: Defer to PT evaluation RLE Deficits / Details:  Decreased strength and AROM however difficult to assess 2 dementia. Grossly weak bilaterally.    Cervical / Trunk Assessment Cervical / Trunk  Assessment: Kyphotic   Communication Communication Communication: No difficulties   Cognition Arousal/Alertness: Lethargic Behavior During Therapy: Flat affect Overall Cognitive Status: History of cognitive impairments - at baseline                     Current Attention Level: Sustained               General Comments  Pt follows some commands    Exercises     Shoulder Instructions      Home Living Family/patient expects to be discharged to:: Skilled nursing facility                                 Additional Comments: Pt with dementia at baseline- Pt unable to provide history.      Prior Functioning/Environment Level of Independence: Needs assistance        Comments: Pt unable to provide a history. Likely required some assistance for ADL's however unsure of true PLOF.         OT Problem List: Decreased strength;Impaired balance (sitting and/or standing);Decreased safety awareness;Impaired UE functional use;Decreased coordination      OT Treatment/Interventions: Self-care/ADL training;Therapeutic exercise;Neuromuscular education;Energy conservation;DME and/or AE instruction;Therapeutic activities;Manual therapy;Patient/family education;Balance training    OT Goals(Current goals can be found in the care plan section) Acute Rehab OT Goals Patient Stated Goal: Unable to state goals at this time.  OT Goal Formulation: With patient Time For Goal Achievement: 12/28/18 Potential to Achieve Goals: Good ADL Goals Pt Will Perform Eating: with min assist;bed level Pt Will Perform Grooming: with min assist;sitting;bed level Pt Will Transfer to Toilet: with max assist;bedside commode Additional ADL Goal #1: Pt will tolerate EOB tasks x10 mins with fair sitting balance and reduce lean to R side.  OT Frequency: Min 2X/week   Barriers to D/C:            Co-evaluation PT/OT/SLP Co-Evaluation/Treatment: Yes Reason for Co-Treatment: Complexity of  the patient's impairments (multi-system involvement);Necessary to address cognition/behavior during functional activity;For patient/therapist safety;To address functional/ADL transfers PT goals addressed during session: Mobility/safety with mobility;Balance OT goals addressed during session: ADL's and self-care      AM-PAC OT "6 Clicks" Daily Activity     Outcome Measure Help from another person eating meals?: Total Help from another person taking care of personal grooming?: A Lot Help from another person toileting, which includes using toliet, bedpan, or urinal?: Total Help from another person bathing (including washing, rinsing, drying)?: Total Help from another person to put on and taking off regular upper body clothing?: Total Help from another person to put on and taking off regular lower body clothing?: Total 6 Click Score: 7   End of Session Equipment Utilized During Treatment: Gait belt Nurse Communication: Mobility status  Activity Tolerance: Patient limited by fatigue Patient left: in chair;with call bell/phone within reach;with chair alarm set  OT Visit Diagnosis: Unsteadiness on feet (R26.81);Muscle weakness (generalized) (M62.81)                Time: 1113-1140 OT Time Calculation (min): 27 min Charges:  OT General Charges $OT Visit: 1 Visit OT Evaluation $OT Eval Moderate Complexity: 1 Mod  Darryl Nestle) Marsa Aris OTR/L Acute Rehabilitation Services Pager: [phone] Office: [phone]   Audie Pinto 12/14/2018, 2:21 PM

## 2018-12-14 NOTE — Plan of Care (Signed)
Progressing towards goals

## 2018-12-14 NOTE — Progress Notes (Signed)
Patient arrived in the unit at 2220pm escorted by ED CNA, A&O x1, denied of pain, no s/s of distress, situated in a bed comfortably, set up tele monitor box no 29, all safety and comfort measures are in placed, and will continue to monitor closely.

## 2018-12-14 NOTE — Progress Notes (Signed)
Patient's son Mr. Blaise Palladino wants to talk to attending physician.  his cell phone [phone].

## 2018-12-14 NOTE — Evaluation (Signed)
Clinical/Bedside Swallow Evaluation Patient Details  Name: Patricia Gilbert MRN: [id number] Date of Birth: 1925/11/03  Today's Date: 12/14/2018 Time: SLP Start Time (ACUTE ONLY): 0910 SLP Stop Time (ACUTE ONLY): 0930 SLP Time Calculation (min) (ACUTE ONLY): 20 min  Past Medical History:  Past Medical History:  Diagnosis Date  . Allergic rhinitis, cause unspecified   . Hypothyroid   . Melanoma ([place]) 1997  . Sinus tachycardia   . Syncope 08/20/2011   single event, neg echo and cards eval    Past Surgical History:  Past Surgical History:  Procedure Laterality Date  . CESAREAN SECTION     [id number]  . HIP PINNING,CANNULATED Left 12/31/2017   Procedure: CANNULATED HIP PINNING;  Surgeon: Mcarthur Rossetti, MD;  Location: [facility];  Service: Orthopedics;  Laterality: Left;  Marland Kitchen MELANOMA EXCISION  1997   HPI:  Patient is a 85 y.o. female with PMH: dementia, syncope, HTN, hypothyroid, sinus tachycardia, who is a SNF resident, presented to hospital with acute right sided weakness. MRI brain revealed small areas of reduced diffusion compatible with acute/early subacute infarction within bilateral occipital lobes as well as numerous foci throughout cerebellum, left pons, bilateral frontal and parietal lobes and left temporal lobe.   Assessment / Plan / Recommendation Clinical Impression  Patient presents with a moderate oral and a mild-moderate pharyngeal dysphagia with delays in oral transit of puree solids, patient chewing with purees, delayed swallow initiation, immediate and delayed cough with thin liquids. Plan to initiate Dys 1, puree solids and nectar thick liquids and monitor for toleration as well as readiness to upgrade versus need for objective swallow assessment. SLP Visit Diagnosis: Dysphagia, unspecified (R13.10)    Aspiration Risk  Mild aspiration risk;Moderate aspiration risk    Diet Recommendation Dysphagia 1 (Puree);Nectar-thick liquid   Liquid Administration via: Cup;No  straw Medication Administration: Crushed with puree Supervision: Full supervision/cueing for compensatory strategies;Staff to assist with self feeding Compensations: Minimize environmental distractions;Slow rate;Small sips/bites Postural Changes: Seated upright at 90 degrees    Other  Recommendations Oral Care Recommendations: Oral care BID Other Recommendations: Order thickener from pharmacy;Prohibited food (jello, ice cream, thin soups);Clarify dietary restrictions;Remove water pitcher   Follow up Recommendations Skilled Nursing facility      Frequency and Duration min 2x/week  1 week       Prognosis Prognosis for Safe Diet Advancement: Fair Barriers to Reach Goals: Cognitive deficits      Swallow Study   General Date of Onset: 12/13/18 HPI: Patient is a 85 y.o. female with PMH: dementia, syncope, HTN, hypothyroid, sinus tachycardia, who is a SNF resident, presented to hospital with acute right sided weakness. MRI brain revealed small areas of reduced diffusion compatible with acute/early subacute infarction within bilateral occipital lobes as well as numerous foci throughout cerebellum, left pons, bilateral frontal and parietal lobes and left temporal lobe. Type of Study: Bedside Swallow Evaluation Previous Swallow Assessment: N/A Diet Prior to this Study: Regular;Thin liquids Temperature Spikes Noted: No Respiratory Status: Room air History of Recent Intubation: No Behavior/Cognition: Alert;Cooperative;Pleasant mood Oral Cavity Assessment: Within Functional Limits Oral Care Completed by SLP: Yes Oral Cavity - Dentition: Adequate natural dentition Self-Feeding Abilities: Needs assist;Needs set up Patient Positioning: Upright in bed Baseline Vocal Quality: Normal Volitional Cough: Weak Volitional Swallow: Unable to elicit    Oral/Motor/Sensory Function Overall Oral Motor/Sensory Function: Within functional limits   Ice Chips     Thin Liquid Thin Liquid:  Impaired Presentation: Cup;Straw Pharyngeal  Phase Impairments: Cough - Immediate;Cough - Delayed  Nectar Thick Nectar Thick Liquid: Impaired Presentation: Cup Pharyngeal Phase Impairments: Suspected delayed Swallow   Honey Thick Honey Thick Liquid: Not tested   Puree Puree: Impaired Oral Phase Functional Implications: Prolonged oral transit   Solid     Solid: Not tested      Dannial Monarch 12/14/2018,1:35 PM    Sonia Baller, MA, CCC-SLP Speech Therapy [facility] Pager: [phone]

## 2018-12-15 ENCOUNTER — Encounter (HOSPITAL_COMMUNITY): Payer: TRICARE For Life (TFL)

## 2018-12-15 LAB — NOVEL CORONAVIRUS, NAA (HOSP ORDER, SEND-OUT TO REF LAB; TAT 18-24 HRS): SARS-CoV-2, NAA: NOT DETECTED

## 2018-12-15 NOTE — TOC Initial Note (Addendum)
Transition of Care [facility]) - Initial/Assessment Note    Patient Details  Name: Patricia Gilbert MRN: [id number] Date of Birth: 07-21-1925  [facility] CM/SW Contact:    Geralynn Ochs, LCSW Phone Number: 12/15/2018, 5:37 PM  Clinical Narrative:  Patient from [place], where she has been a resident for some time. CSW confirmed with son Gershon Mussel that the patient's plan is to return. CSW discussed with [facility] admissions, and they will be able to take the patient back whenever she is stable.                  Expected Discharge Plan: Skilled Nursing Facility Barriers to Discharge: Continued Medical Work up   Patient Goals and CMS Choice   CMS Medicare.gov Compare Post Acute Care list provided to:: Patient Represenative (must comment) Choice offered to / list presented to : Adult Children  Expected Discharge Plan and Services Expected Discharge Plan: [place] arrangements for the past 2 months: [place]                                      Prior Living Arrangements/Services Living arrangements for the past 2 months: [place] Lives with:: Facility Resident Patient language and need for interpreter reviewed:: No Do you feel safe going back to the place where you live?: Yes      Need for Family Participation in Patient Care: Yes (Comment) Care giver support system in place?: Yes (comment)   Criminal Activity/Legal Involvement Pertinent to Current Situation/Hospitalization: No - Comment as needed  Activities of Daily Living Home Assistive Devices/Equipment: None ADL Screening (condition at time of admission) Patient's cognitive ability adequate to safely complete daily activities?: No Is the patient deaf or have difficulty hearing?: No Does the patient have difficulty concentrating, remembering, or making decisions?: Yes Does the patient have difficulty dressing or bathing?:  Yes Independently performs ADLs?: No Does the patient have difficulty walking or climbing stairs?: Yes Weakness of Legs: Both Weakness of Arms/Hands: Right  Permission Sought/Granted Permission sought to share information with : Facility Sport and exercise psychologist, Family Supports Permission granted to share information with : Yes, Verbal Permission Granted  Share Information with NAME: Gershon Mussel  Permission granted to share info w AGENCY: [facility]  Permission granted to share info w Relationship: Son     Emotional Assessment   Attitude/Demeanor/Rapport: Unable to Assess Affect (typically observed): Unable to Assess Orientation: : Oriented to Self Alcohol / Substance Use: Not Applicable Psych Involvement: No (comment)  Admission diagnosis:  Cerebrovascular accident (CVA), unspecified mechanism ([place]) [I63.9] Patient Active Problem List   Diagnosis Date Noted  . CVA (cerebral vascular accident) ([place]) 12/13/2018  . HTN (hypertension) 01/02/2018  . Hip fracture ([place]) 12/31/2017  . Left displaced femoral neck fracture ([place]) 12/31/2017  . Wound of left leg 03/13/2015  . Osteoporosis, senile 01/09/2013  . Allergic rhinitis, cause unspecified   . Hypothyroid   . Mild dementia ([place])   . Syncope 08/20/2011  . Sinus tachycardia 08/20/2011   PCP:  Patient, No Pcp Per Pharmacy:   [facility], [place] - [address] [place] [address] Phone: [phone] Fax: [phone]     Social Determinants of Health (SDOH) Interventions    Readmission Risk Interventions No flowsheet data found.

## 2018-12-15 NOTE — Progress Notes (Signed)
STROKE TEAM PROGRESS NOTE   INTERVAL HISTORY Pt drowsy sleepy, lying in bed, arousable but only cooperative with limited exam. No significant neuro change from yesterday. COVID test still pending which makes her TTE and LE venous doppler continue to be pending  Vitals:   12/14/18 1957 12/15/18 0042 12/15/18 0449 12/15/18 0801  BP: (!) 174/52 (!) 162/61 (!) 166/60 (!) 163/93  Pulse: 81 80 88 (!) 105  Resp: 15 15 16 16   Temp: 99.1 F (37.3 C) 98.5 F (36.9 C) 98 F (36.7 C) 98.2 F (36.8 C)  TempSrc: Oral Oral Oral Oral  SpO2: 96% 94% 94% 94%  Weight:      Height:        CBC:  Recent Labs  Lab 12/13/18 1523 12/13/18 1540  WBC 7.5  --   NEUTROABS 5.8  --   HGB 13.5 13.9  HCT 42.0 41.0  MCV 92.5  --   PLT 197  --     Basic Metabolic Panel:  Recent Labs  Lab 12/13/18 1523 12/13/18 1540  NA 135 134*  K 3.9 3.9  CL 102 101  CO2 25  --   GLUCOSE 117* 111*  BUN 12 16  CREATININE 0.64 0.60  CALCIUM 9.2  --    Lipid Panel:     Component Value Date/Time   CHOL 185 12/14/2018 0525   TRIG 62 12/14/2018 0525   HDL 67 12/14/2018 0525   CHOLHDL 2.8 12/14/2018 0525   VLDL 12 12/14/2018 0525   LDLCALC 106 (H) 12/14/2018 0525   HgbA1c:  Lab Results  Component Value Date   HGBA1C 5.2 12/14/2018   Urine Drug Screen:     Component Value Date/Time   LABOPIA NONE DETECTED 12/14/2018 0152   COCAINSCRNUR NONE DETECTED 12/14/2018 0152   LABBENZ NONE DETECTED 12/14/2018 0152   AMPHETMU NONE DETECTED 12/14/2018 0152   THCU NONE DETECTED 12/14/2018 0152   LABBARB NONE DETECTED 12/14/2018 0152    Alcohol Level     Component Value Date/Time   ETH <10 12/13/2018 1523    IMAGING Ct Angio Head W Or Wo Contrast  Result Date: 12/13/2018 CLINICAL DATA:  85 y/o  F; right-sided weakness. EXAM: CT ANGIOGRAPHY HEAD AND NECK TECHNIQUE: Multidetector CT imaging of the head and neck was performed using the standard protocol during bolus administration of intravenous contrast.  Multiplanar CT image reconstructions and MIPs were obtained to evaluate the vascular anatomy. Carotid stenosis measurements (when applicable) are obtained utilizing NASCET criteria, using the distal internal carotid diameter as the denominator. CONTRAST:  141mL OMNIPAQUE IOHEXOL 350 MG/ML SOLN COMPARISON:  12/13/2018 CT head. FINDINGS: CTA NECK FINDINGS Aortic arch: Standard branching. Imaged portion shows no evidence of aneurysm or dissection. No significant stenosis of the major arch vessel origins. Aortic calcific atherosclerosis. Right carotid system: No evidence of dissection, stenosis (50% or greater) or occlusion. Left carotid system: No evidence of dissection, stenosis (50% or greater) or occlusion. Vertebral arteries: Codominant. No evidence of dissection, stenosis (50% or greater) or occlusion. Skeleton: No acute osseous abnormality. Mild cervical spondylosis. C2-3 minimal grade 1 retrolisthesis. Other neck: Nodule within the right tracheoesophageal groove abutting posterior aspect of right lobe of thyroid measuring 14 x 11 x 11 mm (AP x ML x CC series 5, image 132 and series 9, image 85). Upper chest: Biapical pleuroparenchymal scarring. Review of the MIP images confirms the above findings CTA HEAD FINDINGS Anterior circulation: No significant stenosis, proximal occlusion, aneurysm, or vascular malformation. Posterior circulation: No significant stenosis, proximal  occlusion, aneurysm, or vascular malformation. Venous sinuses: As permitted by contrast timing, patent. Anatomic variants: Patent anterior communicating artery. No posterior communicating arteries identified, likely hypoplastic or absent. Bilateral occipital lobe cortical hypodensities. Review of the MIP images confirms the above findings IMPRESSION: 1. Patent carotid and vertebral arteries. No dissection, aneurysm, or hemodynamically significant stenosis utilizing NASCET criteria. 2. Patent anterior and posterior intracranial circulation. No  large vessel occlusion, aneurysm, or significant stenosis. 3. Bilateral occipital lobe cortical hypodensities, possibly areas of recent infarction or PRES given distribution. This can be further characterized with MRI. 4. Right tracheoesophageal groove nodule measuring 14 mm abutting posterior aspect of right lobe of thyroid. Findings may represent a thyroid nodule or parathyroid adenoma. Thyroid ultrasound is recommended on a nonemergent basis. These results were called by telephone at the time of interpretation on 12/13/2018 at 8:05 pm to Dr. Zenia Resides, who verbally acknowledged these results. Electronically Signed   By: Kristine Garbe M.D.   On: 12/13/2018 20:07   Dg Chest 1 View  Result Date: 12/13/2018 CLINICAL DATA:  Shortness of breath. Right-sided weakness. EXAM: CHEST  1 VIEW COMPARISON:  Radiograph and CT 12/30/2017 FINDINGS: Lower lung volumes from prior exam. Unchanged heart size and mediastinal contours allowing for differences in technique and lower lung volumes. No acute airspace disease, pulmonary edema, pleural fluid or pneumothorax. Calcified granuloma in the right lung. The bones are under mineralized. Excreted IV contrast in both renal collecting systems from prior head and neck CTA. IMPRESSION: Low lung volumes without acute abnormality. Electronically Signed   By: Keith Rake M.D.   On: 12/13/2018 20:25   Ct Head Wo Contrast  Result Date: 12/13/2018 CLINICAL DATA:  Altered level of consciousness. Right-sided weakness. EXAM: CT HEAD WITHOUT CONTRAST TECHNIQUE: Contiguous axial images were obtained from the base of the skull through the vertex without intravenous contrast. COMPARISON:  CT head 12/30/2017 FINDINGS: Brain: Moderate atrophy. Moderate chronic white matter changes are stable from the prior study. Small chronic infarct left cerebellum unchanged. Negative for acute infarct, hemorrhage, or mass.  No midline shift. Vascular: Negative for hyperdense vessel Skull: Negative  Sinuses/Orbits: Mucosal edema right maxillary sinus. Associated Peri dental lucency around right upper molars. No orbital lesion. Other: None IMPRESSION: No acute intracranial abnormality. Moderate atrophy and moderate chronic ischemic changes in the white matter appear stable from the prior CT. Electronically Signed   By: Franchot Gallo M.D.   On: 12/13/2018 16:59   Ct Angio Neck W Or Wo Contrast  Result Date: 12/13/2018 CLINICAL DATA:  85 y/o  F; right-sided weakness. EXAM: CT ANGIOGRAPHY HEAD AND NECK TECHNIQUE: Multidetector CT imaging of the head and neck was performed using the standard protocol during bolus administration of intravenous contrast. Multiplanar CT image reconstructions and MIPs were obtained to evaluate the vascular anatomy. Carotid stenosis measurements (when applicable) are obtained utilizing NASCET criteria, using the distal internal carotid diameter as the denominator. CONTRAST:  167mL OMNIPAQUE IOHEXOL 350 MG/ML SOLN COMPARISON:  12/13/2018 CT head. FINDINGS: CTA NECK FINDINGS Aortic arch: Standard branching. Imaged portion shows no evidence of aneurysm or dissection. No significant stenosis of the major arch vessel origins. Aortic calcific atherosclerosis. Right carotid system: No evidence of dissection, stenosis (50% or greater) or occlusion. Left carotid system: No evidence of dissection, stenosis (50% or greater) or occlusion. Vertebral arteries: Codominant. No evidence of dissection, stenosis (50% or greater) or occlusion. Skeleton: No acute osseous abnormality. Mild cervical spondylosis. C2-3 minimal grade 1 retrolisthesis. Other neck: Nodule within the right tracheoesophageal groove  abutting posterior aspect of right lobe of thyroid measuring 14 x 11 x 11 mm (AP x ML x CC series 5, image 132 and series 9, image 85). Upper chest: Biapical pleuroparenchymal scarring. Review of the MIP images confirms the above findings CTA HEAD FINDINGS Anterior circulation: No significant  stenosis, proximal occlusion, aneurysm, or vascular malformation. Posterior circulation: No significant stenosis, proximal occlusion, aneurysm, or vascular malformation. Venous sinuses: As permitted by contrast timing, patent. Anatomic variants: Patent anterior communicating artery. No posterior communicating arteries identified, likely hypoplastic or absent. Bilateral occipital lobe cortical hypodensities. Review of the MIP images confirms the above findings IMPRESSION: 1. Patent carotid and vertebral arteries. No dissection, aneurysm, or hemodynamically significant stenosis utilizing NASCET criteria. 2. Patent anterior and posterior intracranial circulation. No large vessel occlusion, aneurysm, or significant stenosis. 3. Bilateral occipital lobe cortical hypodensities, possibly areas of recent infarction or PRES given distribution. This can be further characterized with MRI. 4. Right tracheoesophageal groove nodule measuring 14 mm abutting posterior aspect of right lobe of thyroid. Findings may represent a thyroid nodule or parathyroid adenoma. Thyroid ultrasound is recommended on a nonemergent basis. These results were called by telephone at the time of interpretation on 12/13/2018 at 8:05 pm to Dr. Zenia Resides, who verbally acknowledged these results. Electronically Signed   By: Kristine Garbe M.D.   On: 12/13/2018 20:07   Mr Jeri Cos QA Contrast  Result Date: 12/13/2018 CLINICAL DATA:  85 y/o  F; right-sided weakness.  Stroke evaluation. EXAM: MRI HEAD WITHOUT AND WITH CONTRAST TECHNIQUE: Multiplanar, multiecho pulse sequences of the brain and surrounding structures were obtained without and with intravenous contrast. CONTRAST:  5 cc Gadavist. COMPARISON:  12/13/2018 and 12/30/2017 CT head FINDINGS: Brain: Motion degradation of multiple sequences. Small (approximately 2 cm) areas of reduced diffusion within the bilateral occipital lobes corresponding to hypodensities on CT. Additionally there are  numerous subcentimeter foci of reduced diffusion throughout the right greater than left cerebellar hemispheres, left hemi pons as well as scattered throughout bilateral frontal, bilateral parietal, in the left temporal lobe. Findings are compatible with acute/early subacute infarction. Multiple vascular territories favors embolic etiology. Concurrent PRES is considered less likely. Large confluent nonspecific T2 FLAIR hyperintensities in subcortical and periventricular white matter as well as the pons are compatible with severe chronic microvascular ischemic changes in similar in comparison with white matter hypodensities on prior CTs of the head. Moderate volume loss of the brain. After administration of intravenous contrast there is a linear 10 mm focus of enhancement within the right caudate head with associated T2 hyperintensity in without reduced diffusion or mass effect, series 11, image 30. No additional abnormal intracranial enhancement identified. Vascular: Normal flow voids. Skull and upper cervical spine: Normal marrow signal. Sinuses/Orbits: Moderate my maxillary sinus mucosal thickening with small fluid level. Normal aeration of mastoid air cells. Orbits are unremarkable. Other: None. IMPRESSION: Motion degraded study. 1. Small areas of reduced diffusion compatible with acute/early subacute infarction within bilateral occipital lobes as well as numerous subcentimeter foci throughout cerebellum, left pons, bilateral frontal lobes, bilateral parietal lobes, and the left temporal lobe. Multiple vascular territories favors embolic etiology, concurrent PRES is considered less likely. No hemorrhage or mass effect. 2. 10 mm linear enhancement in right caudate head without mass effect or reduced diffusion, in the absence of primary malignancy this is probably a subacute infarction. Consider 3 month follow-up MRI of the brain with and without contrast to ensure stability. 3. Severe chronic microvascular ischemic  changes and moderate volume loss of  the brain. These results will be called to the ordering clinician or representative by the Radiologist Assistant, and communication documented in the PACS or zVision Dashboard. Electronically Signed   By: Kristine Garbe M.D.   On: 12/13/2018 21:18   US Thyroid  Result Date: 12/15/2018 CLINICAL DATA:  Right thyroid lesion on CT EXAM: THYROID ULTRASOUND TECHNIQUE: Ultrasound examination of the thyroid gland and adjacent soft tissues was performed. COMPARISON:  CT from the previous day FINDINGS: Parenchymal Echotexture: Mildly heterogenous Isthmus: Diminutive Right lobe: 2.7 x 0.9 x 1.4 cm Left lobe: 3.4 x 0.5 x 1.6 cm _________________________________________________________ Estimated total number of nodules >/= 1 cm: 0 Number of spongiform nodules >/=  2 cm not described below (TR1): 0 Number of mixed cystic and solid nodules >/= 1.5 cm not described below (TR2): 0 _________________________________________________________ No discrete nodules are seen within the thyroid gland. IMPRESSION: 1. Small mildly heterogenous thyroid.  No discrete nodule. If there is clinical or laboratory suggestion of parathyroid adenoma, scintigraphy may be useful for further evaluation The above is in keeping with the ACR TI-RADS recommendations - J Am Coll Radiol 2017;14:587-595. Electronically Signed   By: Lucrezia Europe M.D.   On: 12/15/2018 07:55    PHYSICAL EXAM  Temp:  [97.9 F (36.6 C)-99.1 F (37.3 C)] 98.2 F (36.8 C) (06/19 0801) Pulse Rate:  [72-105] 105 (06/19 0801) Resp:  [15-18] 16 (06/19 0801) BP: (129-174)/(45-93) 163/93 (06/19 0801) SpO2:  [94 %-99 %] 94 % (06/19 0801)  General - Well nourished, well developed, lethargic, flat affect.  Ophthalmologic - fundi not visualized due to noncooperation.  Cardiovascular - Regular rate and rhythm.  Neuro - drowsy and sleepy, able to open eyes on voice, only following limited verbal commands.  Able to tell me her name,  however not orientated to age place or time.  Able to repeat simple sentences, naming 2/4.  Blinking to visual threat bilaterally, PERRL, no gaze deviation.  Left facial droop.  Tongue midline.  Right upper extremity 0/5, flaccid.  Left upper extremity 3/5.  Bilateral lower extremity 3-/5.  Left unsustained ankle clonus.  Babinski negative.  Sensation subjectively symmetrical, finger-to-nose not corporative.  Gait not tested   ASSESSMENT/PLAN Patricia Gilbert is a 85 y.o. female with history of dementia presenting from SNF with R side weakness.   Stroke:   Multiple bilateral small anterior and posterior circulation infarcts, cardio embolic pattern, secondary to unknown source, suspicious for occult PAF  CT head No acute stroke. Small vessel disease. Moderate atrophy.     CTA head & neck no LVO.  Left P3 occlusion otherwise patent vessels. B occipital hypodensities.   MRI  B occipital lobe ischemia alone with mult small B cerebellar, L pons, B frontal lobes, B parietal lobes and L temporal lobe infarcts. R caudate head w/ linear enhancement in the absence of malignancy felt to be a  Subacute infarct.   LE Doppler  pending  2D Echo pending   Given advanced age and dementia, only recommend 30 day monitor at d/c to look for AF as possible source of stroke  LDL 106  HgbA1c 5.2  Lovenox 30 mg sq daily for VTE prophylaxis  No antithrombotic prior to admission, now on clopidogrel 75 mg daily stroke prevention. Patient is allergic to aspirin   Therapy recommendations:  SNF  Disposition:  Return to SNF  Hypertension  Stable . Permissive hypertension (OK if < 220/120) but gradually normalize in 2-3 days . Long-term BP goal normotensive  Hyperlipidemia  Home meds:  No statin  Now on Lipitor 20  LDL 106, goal < 70  Continue statin at discharge  Other Stroke Risk Factors  Advanced age  ETOH use, advised to drink no more than 1 drink(s) a day  Family hx stroke  (other)  Other Active Problems  Hypothyroid, on synthroid, TSH normal  Dementia, nursing home resident  CTA showed R tracheoesophageal  grove nodule - thyroid US -  no nodule seen  Hospital day # 2  Rosalin Hawking, MD PhD Stroke Neurology 12/15/2018 11:24 AM    To contact Stroke Continuity provider, please refer to [email or web address] After hours, contact General Neurology

## 2018-12-15 NOTE — Care Management Important Message (Signed)
Important Message  Patient Details  Name: [name] MRN: [id number] Date of Birth: 09-29-1925   Medicare Important Message Given:  Yes     Hue Steveson Montine Circle 12/15/2018, 11:21 AM

## 2018-12-15 NOTE — Plan of Care (Signed)
Progressing towards goals

## 2018-12-15 NOTE — Progress Notes (Signed)
  Speech Language Pathology Treatment: Dysphagia  Patient Details Name: Patricia Gilbert MRN: [id number] DOB: 01-19-1926 Today's Date: 12/15/2018 Time: 1445-1500 SLP Time Calculation (min) (ACUTE ONLY): 15 min  Assessment / Plan / Recommendation Clinical Impression  F/u after yesterday's swallow assessment.  Pt with poor motivation to eat, repeatedly saying "I don't want to do anything," but then demonstrating some willingness to drink, eat from lunch tray which was generally untouched.  She was able to use LUE to hold and drink from a cup.  Demonstrated oral holding, prolonged oral preparation with nectar liquids and pureed solids.  She required multiple sub-swallows with each bolus before being ready for more PO intake.  No overt s/s of aspiration observed.  After only a few bites/sips, she declined any further POs despite gentle encouragement.  Dysphagia 1/ nectar-thick liquid is likely the safest diet at this time.  SLP will follow for readiness to advance/safety.    HPI HPI: Patient is a 85 y.o. female with PMH: dementia, syncope, HTN, hypothyroid, sinus tachycardia, who is a SNF resident, presented to hospital with acute right sided weakness. MRI brain revealed small areas of reduced diffusion compatible with acute/early subacute infarction within bilateral occipital lobes as well as numerous foci throughout cerebellum, left pons, bilateral frontal and parietal lobes and left temporal lobe.      SLP Plan  Continue with current plan of care       Recommendations  Diet recommendations: Dysphagia 1 (puree);Nectar-thick liquid Liquids provided via: Cup;Straw Medication Administration: Crushed with puree Supervision: Patient able to self feed;Staff to assist with self feeding(assist with set up) Compensations: Minimize environmental distractions;Slow rate;Small sips/bites Postural Changes and/or Swallow Maneuvers: Seated upright 90 degrees                Oral Care Recommendations:  Oral care BID Follow up Recommendations: Skilled Nursing facility Plan: Continue with current plan of care       GO                Patricia Gilbert 12/15/2018, 3:07 PM  Patricia Gilbert L. Tivis Ringer, [place] Office number [phone]

## 2018-12-15 NOTE — Progress Notes (Addendum)
PROGRESS NOTE    Patricia Gilbert  XQJ:[id number] DOB: [birth year] DOA: 12/13/2018 PCP: Patient, No Pcp Per    Brief Narrative:85 y.o.femalewith medical history significant ofhypertension hypothyroidism who was sent in from the nursing home as a staff noticed right-sided weakness while at lunch. Apparently she was seen by her family this weekend and she was at her baseline. She has a history of dementia. She is able to speak and answer some of the questions. She thinks she is at the [facility] she has 2 children. She denies any headache nausea vomiting diarrhea chest pain shortness of breath cough fever chills abdominal pain.  ED Course:Sodium 134 potassium 3.9 BUN 16 creatinine 0.60 hemoglobin 13.9 platelet count 197 white count 7.5. CT of the head shows chronic ischemic changes and moderate atrophy. ED physician discussed with Dr. Malen Gauze and recommended stroke work-up. Review of Systems: As per HPI otherwise all other systems reviewed and are negative  Assessment & Plan:   Active Problems:   CVA (cerebral vascular accident) [facility])    CVA (cerebral vascular accident) ([place])  #1embolic  CVA  with right-sided weakness-patient admitted with right upper and lower extremity weakness witnessed by the staff at the nursing home. CT scan of the head does not show any acute finding.  MRI of the brain shows acute to subacute infarction within the bilateral occipital lobes as well as numerous subcentimeter foci throughout the cerebellum left pons bilateral frontal lobes bilateral parietal lobes and the left temporal lobe.  Multiple vascular territories favor embolic etiology.  No hemorrhage or mass-effect.  10 mm enhancement in the right caudate head possibly subacute infarction.  Severe chronic microvascular ischemic changes and moderate volume loss of the brain.  Hemoglobin A1c is 5.2 LDL is 106 TSH is 3.03.   Echocardiogram and carotid ultrasound are pending.   CT angiogram of the head and  neck shows patent carotid and vertebral arteries patent anterior and posterior intracranial circulation.  Bilateral occipital lobe cortical hypodensity possibly areas of recent infarction or press given distribution .continue statin and plavix ..allergic to asa. Patient needs ongoing stroke work up.. PT recommends SNF COVID PENDING  Echo pending Lower extremity Doppler done results pending   Essential hypertension patient is on Cozaar at home which will be held allow permissive hypertension. Blood pressure here is 166/55.  Hypothyroidism continue Synthroid check TSH  Family communication discussed with her son Gershon Mussel at [phone] Disposition pending further stroke work-up and covert pending status patient will be discharged back to the skilled nursing facility with PT once above is done DVT prophylaxis Lovenox CODE STATUS DO NOT RESUSCITATE  Estimated body mass index is 18.83 kg/m as calculated from the following:   Height as of this encounter: 5\' 1"  (1.549 m).   Weight as of this encounter: 45.2 kg.    Subjective:  Patient is resting in bed with right sided weakness able to communicate able to speak she still thinks she is at her on nursing home she denied any headache nausea vomiting she named the names of her 2 sons she has no other children other than 2 sons Objective: Vitals:   12/15/18 0042 12/15/18 0449 12/15/18 0801 12/15/18 1153  BP: (!) 162/61 (!) 166/60 (!) 163/93 (!) 123/44  Pulse: 80 88 (!) 105 81  Resp: 15 16 16  (!) 1  Temp: 98.5 F (36.9 C) 98 F (36.7 C) 98.2 F (36.8 C) 97.9 F (36.6 C)  TempSrc: Oral Oral Oral Oral  SpO2: 94% 94% 94% 97%  Weight:  Height:        Intake/Output Summary (Last 24 hours) at 12/15/2018 1246 Last data filed at 12/15/2018 0900 Gross per 24 hour  Intake 240 ml  Output --  Net 240 ml   Filed Weights   12/13/18 1513 12/13/18 2223  Weight: 45.5 kg 45.2 kg    Examination:  General exam: Appears calm and comfortable   Respiratory system: Clear to auscultation. Respiratory effort normal. Cardiovascular system: S1 & S2 heard, RRR. No JVD, murmurs, rubs, gallops or clicks. No pedal edema. Gastrointestinal system: Abdomen is nondistended, soft and nontender. No organomegaly or masses felt. Normal bowel sounds heard. Central nervous system she is awake she is thinks she is at the nursing home right upper and lower extremity 1 x 5  extremities: Symmetric 5 x 5 power. Skin: No rashes, lesions or ulcers Psychiatry: Judgement and insight appear normal. Mood & affect appropriate.     Data Reviewed: I have personally reviewed following labs and imaging studies  CBC: Recent Labs  Lab 12/13/18 1523 12/13/18 1540  WBC 7.5  --   NEUTROABS 5.8  --   HGB 13.5 13.9  HCT 42.0 41.0  MCV 92.5  --   PLT 197  --    Basic Metabolic Panel: Recent Labs  Lab 12/13/18 1523 12/13/18 1540  NA 135 134*  K 3.9 3.9  CL 102 101  CO2 25  --   GLUCOSE 117* 111*  BUN 12 16  CREATININE 0.64 0.60  CALCIUM 9.2  --    GFR: Estimated Creatinine Clearance: 33.4 mL/min (by C-G formula based on SCr of 0.6 mg/dL). Liver Function Tests: Recent Labs  Lab 12/13/18 1523  AST 24  ALT 14  ALKPHOS 59  BILITOT 0.5  PROT 6.9  ALBUMIN 3.5   No results for input(s): LIPASE, AMYLASE in the last 168 hours. No results for input(s): AMMONIA in the last 168 hours. Coagulation Profile: Recent Labs  Lab 12/13/18 1523  INR 1.1   Cardiac Enzymes: No results for input(s): CKTOTAL, CKMB, CKMBINDEX, TROPONINI in the last 168 hours. BNP (last 3 results) No results for input(s): PROBNP in the last 8760 hours. HbA1C: Recent Labs    12/14/18 0525  HGBA1C 5.2   CBG: No results for input(s): GLUCAP in the last 168 hours. Lipid Profile: Recent Labs    12/14/18 0525  CHOL 185  HDL 67  LDLCALC 106*  TRIG 62  CHOLHDL 2.8   Thyroid Function Tests: Recent Labs    12/14/18 0525  TSH 3.036   Anemia Panel: No results for  input(s): VITAMINB12, FOLATE, FERRITIN, TIBC, IRON, RETICCTPCT in the last 72 hours. Sepsis Labs: No results for input(s): PROCALCITON, LATICACIDVEN in the last 168 hours.  Recent Results (from the past 240 hour(s))  MRSA PCR Screening     Status: None   Collection Time: 12/14/18  1:40 AM   Specimen: Nasal Mucosa; Nasopharyngeal  Result Value Ref Range Status   MRSA by PCR NEGATIVE NEGATIVE Final    Comment:        The GeneXpert MRSA Assay (FDA approved for NASAL specimens only), is one component of a comprehensive MRSA colonization surveillance program. It is not intended to diagnose MRSA infection nor to guide or monitor treatment for MRSA infections. Performed at [facility], [place] [address], [place], [place] [address]          Radiology Studies: Ct Angio Head W Or Wo Contrast  Result Date: 12/13/2018 CLINICAL DATA:  85 y/o  F; right-sided weakness. EXAM: CT ANGIOGRAPHY HEAD AND NECK TECHNIQUE: Multidetector CT imaging of the head and neck was performed using the standard protocol during bolus administration of intravenous contrast. Multiplanar CT image reconstructions and MIPs were obtained to evaluate the vascular anatomy. Carotid stenosis measurements (when applicable) are obtained utilizing NASCET criteria, using the distal internal carotid diameter as the denominator. CONTRAST:  12mL OMNIPAQUE IOHEXOL 350 MG/ML SOLN COMPARISON:  12/13/2018 CT head. FINDINGS: CTA NECK FINDINGS Aortic arch: Standard branching. Imaged portion shows no evidence of aneurysm or dissection. No significant stenosis of the major arch vessel origins. Aortic calcific atherosclerosis. Right carotid system: No evidence of dissection, stenosis (50% or greater) or occlusion. Left carotid system: No evidence of dissection, stenosis (50% or greater) or occlusion. Vertebral arteries: Codominant. No evidence of dissection, stenosis (50% or greater) or occlusion. Skeleton: No acute osseous abnormality. Mild  cervical spondylosis. C2-3 minimal grade 1 retrolisthesis. Other neck: Nodule within the right tracheoesophageal groove abutting posterior aspect of right lobe of thyroid measuring 14 x 11 x 11 mm (AP x ML x CC series 5, image 132 and series 9, image 85). Upper chest: Biapical pleuroparenchymal scarring. Review of the MIP images confirms the above findings CTA HEAD FINDINGS Anterior circulation: No significant stenosis, proximal occlusion, aneurysm, or vascular malformation. Posterior circulation: No significant stenosis, proximal occlusion, aneurysm, or vascular malformation. Venous sinuses: As permitted by contrast timing, patent. Anatomic variants: Patent anterior communicating artery. No posterior communicating arteries identified, likely hypoplastic or absent. Bilateral occipital lobe cortical hypodensities. Review of the MIP images confirms the above findings IMPRESSION: 1. Patent carotid and vertebral arteries. No dissection, aneurysm, or hemodynamically significant stenosis utilizing NASCET criteria. 2. Patent anterior and posterior intracranial circulation. No large vessel occlusion, aneurysm, or significant stenosis. 3. Bilateral occipital lobe cortical hypodensities, possibly areas of recent infarction or PRES given distribution. This can be further characterized with MRI. 4. Right tracheoesophageal groove nodule measuring 14 mm abutting posterior aspect of right lobe of thyroid. Findings may represent a thyroid nodule or parathyroid adenoma. Thyroid ultrasound is recommended on a nonemergent basis. These results were called by telephone at the time of interpretation on 12/13/2018 at 8:05 pm to Dr. Zenia Resides, who verbally acknowledged these results. Electronically Signed   By: Kristine Garbe M.D.   On: 12/13/2018 20:07   Dg Chest 1 View  Result Date: 12/13/2018 CLINICAL DATA:  Shortness of breath. Right-sided weakness. EXAM: CHEST  1 VIEW COMPARISON:  Radiograph and CT 12/30/2017 FINDINGS: Lower  lung volumes from prior exam. Unchanged heart size and mediastinal contours allowing for differences in technique and lower lung volumes. No acute airspace disease, pulmonary edema, pleural fluid or pneumothorax. Calcified granuloma in the right lung. The bones are under mineralized. Excreted IV contrast in both renal collecting systems from prior head and neck CTA. IMPRESSION: Low lung volumes without acute abnormality. Electronically Signed   By: Keith Rake M.D.   On: 12/13/2018 20:25   Ct Head Wo Contrast  Result Date: 12/13/2018 CLINICAL DATA:  Altered level of consciousness. Right-sided weakness. EXAM: CT HEAD WITHOUT CONTRAST TECHNIQUE: Contiguous axial images were obtained from the base of the skull through the vertex without intravenous contrast. COMPARISON:  CT head 12/30/2017 FINDINGS: Brain: Moderate atrophy. Moderate chronic white matter changes are stable from the prior study. Small chronic infarct left cerebellum unchanged. Negative for acute infarct, hemorrhage, or mass.  No midline shift. Vascular: Negative for hyperdense vessel Skull: Negative Sinuses/Orbits: Mucosal edema right maxillary sinus. Associated Peri dental lucency around  right upper molars. No orbital lesion. Other: None IMPRESSION: No acute intracranial abnormality. Moderate atrophy and moderate chronic ischemic changes in the white matter appear stable from the prior CT. Electronically Signed   By: Franchot Gallo M.D.   On: 12/13/2018 16:59   Ct Angio Neck W Or Wo Contrast  Result Date: 12/13/2018 CLINICAL DATA:  85 y/o  F; right-sided weakness. EXAM: CT ANGIOGRAPHY HEAD AND NECK TECHNIQUE: Multidetector CT imaging of the head and neck was performed using the standard protocol during bolus administration of intravenous contrast. Multiplanar CT image reconstructions and MIPs were obtained to evaluate the vascular anatomy. Carotid stenosis measurements (when applicable) are obtained utilizing NASCET criteria, using the  distal internal carotid diameter as the denominator. CONTRAST:  159mL OMNIPAQUE IOHEXOL 350 MG/ML SOLN COMPARISON:  12/13/2018 CT head. FINDINGS: CTA NECK FINDINGS Aortic arch: Standard branching. Imaged portion shows no evidence of aneurysm or dissection. No significant stenosis of the major arch vessel origins. Aortic calcific atherosclerosis. Right carotid system: No evidence of dissection, stenosis (50% or greater) or occlusion. Left carotid system: No evidence of dissection, stenosis (50% or greater) or occlusion. Vertebral arteries: Codominant. No evidence of dissection, stenosis (50% or greater) or occlusion. Skeleton: No acute osseous abnormality. Mild cervical spondylosis. C2-3 minimal grade 1 retrolisthesis. Other neck: Nodule within the right tracheoesophageal groove abutting posterior aspect of right lobe of thyroid measuring 14 x 11 x 11 mm (AP x ML x CC series 5, image 132 and series 9, image 85). Upper chest: Biapical pleuroparenchymal scarring. Review of the MIP images confirms the above findings CTA HEAD FINDINGS Anterior circulation: No significant stenosis, proximal occlusion, aneurysm, or vascular malformation. Posterior circulation: No significant stenosis, proximal occlusion, aneurysm, or vascular malformation. Venous sinuses: As permitted by contrast timing, patent. Anatomic variants: Patent anterior communicating artery. No posterior communicating arteries identified, likely hypoplastic or absent. Bilateral occipital lobe cortical hypodensities. Review of the MIP images confirms the above findings IMPRESSION: 1. Patent carotid and vertebral arteries. No dissection, aneurysm, or hemodynamically significant stenosis utilizing NASCET criteria. 2. Patent anterior and posterior intracranial circulation. No large vessel occlusion, aneurysm, or significant stenosis. 3. Bilateral occipital lobe cortical hypodensities, possibly areas of recent infarction or PRES given distribution. This can be further  characterized with MRI. 4. Right tracheoesophageal groove nodule measuring 14 mm abutting posterior aspect of right lobe of thyroid. Findings may represent a thyroid nodule or parathyroid adenoma. Thyroid ultrasound is recommended on a nonemergent basis. These results were called by telephone at the time of interpretation on 12/13/2018 at 8:05 pm to Dr. Zenia Resides, who verbally acknowledged these results. Electronically Signed   By: Kristine Garbe M.D.   On: 12/13/2018 20:07   Mr Jeri Cos ZO Contrast  Result Date: 12/13/2018 CLINICAL DATA:  85 y/o  F; right-sided weakness.  Stroke evaluation. EXAM: MRI HEAD WITHOUT AND WITH CONTRAST TECHNIQUE: Multiplanar, multiecho pulse sequences of the brain and surrounding structures were obtained without and with intravenous contrast. CONTRAST:  5 cc Gadavist. COMPARISON:  12/13/2018 and 12/30/2017 CT head FINDINGS: Brain: Motion degradation of multiple sequences. Small (approximately 2 cm) areas of reduced diffusion within the bilateral occipital lobes corresponding to hypodensities on CT. Additionally there are numerous subcentimeter foci of reduced diffusion throughout the right greater than left cerebellar hemispheres, left hemi pons as well as scattered throughout bilateral frontal, bilateral parietal, in the left temporal lobe. Findings are compatible with acute/early subacute infarction. Multiple vascular territories favors embolic etiology. Concurrent PRES is considered less likely. Large confluent nonspecific  T2 FLAIR hyperintensities in subcortical and periventricular white matter as well as the pons are compatible with severe chronic microvascular ischemic changes in similar in comparison with white matter hypodensities on prior CTs of the head. Moderate volume loss of the brain. After administration of intravenous contrast there is a linear 10 mm focus of enhancement within the right caudate head with associated T2 hyperintensity in without reduced diffusion  or mass effect, series 11, image 30. No additional abnormal intracranial enhancement identified. Vascular: Normal flow voids. Skull and upper cervical spine: Normal marrow signal. Sinuses/Orbits: Moderate my maxillary sinus mucosal thickening with small fluid level. Normal aeration of mastoid air cells. Orbits are unremarkable. Other: None. IMPRESSION: Motion degraded study. 1. Small areas of reduced diffusion compatible with acute/early subacute infarction within bilateral occipital lobes as well as numerous subcentimeter foci throughout cerebellum, left pons, bilateral frontal lobes, bilateral parietal lobes, and the left temporal lobe. Multiple vascular territories favors embolic etiology, concurrent PRES is considered less likely. No hemorrhage or mass effect. 2. 10 mm linear enhancement in right caudate head without mass effect or reduced diffusion, in the absence of primary malignancy this is probably a subacute infarction. Consider 3 month follow-up MRI of the brain with and without contrast to ensure stability. 3. Severe chronic microvascular ischemic changes and moderate volume loss of the brain. These results will be called to the ordering clinician or representative by the Radiologist Assistant, and communication documented in the PACS or zVision Dashboard. Electronically Signed   By: Kristine Garbe M.D.   On: 12/13/2018 21:18   US Thyroid  Result Date: 12/15/2018 CLINICAL DATA:  Right thyroid lesion on CT EXAM: THYROID ULTRASOUND TECHNIQUE: Ultrasound examination of the thyroid gland and adjacent soft tissues was performed. COMPARISON:  CT from the previous day FINDINGS: Parenchymal Echotexture: Mildly heterogenous Isthmus: Diminutive Right lobe: 2.7 x 0.9 x 1.4 cm Left lobe: 3.4 x 0.5 x 1.6 cm _________________________________________________________ Estimated total number of nodules >/= 1 cm: 0 Number of spongiform nodules >/=  2 cm not described below (TR1): 0 Number of mixed cystic and  solid nodules >/= 1.5 cm not described below (TR2): 0 _________________________________________________________ No discrete nodules are seen within the thyroid gland. IMPRESSION: 1. Small mildly heterogenous thyroid.  No discrete nodule. If there is clinical or laboratory suggestion of parathyroid adenoma, scintigraphy may be useful for further evaluation The above is in keeping with the ACR TI-RADS recommendations - J Am Coll Radiol 2017;14:587-595. Electronically Signed   By: Lucrezia Europe M.D.   On: 12/15/2018 07:55        Scheduled Meds:  atorvastatin  20 mg Oral q1800   clopidogrel  75 mg Oral Daily   enoxaparin (LOVENOX) injection  30 mg Subcutaneous QHS   levothyroxine  75 mcg Oral Daily   Continuous Infusions:   LOS: 2 days     Georgette Shell, MD [facility]  If 7PM-7AM, please contact night-coverage [email or web address] Password [facility] 12/15/2018, 12:46 PM

## 2018-12-16 ENCOUNTER — Inpatient Hospital Stay (HOSPITAL_COMMUNITY): Payer: TRICARE For Life (TFL)

## 2018-12-16 ENCOUNTER — Inpatient Hospital Stay (HOSPITAL_COMMUNITY): Payer: Medicare Other

## 2018-12-16 DIAGNOSIS — I340 Nonrheumatic mitral (valve) insufficiency: Secondary | ICD-10-CM

## 2018-12-16 DIAGNOSIS — I10 Essential (primary) hypertension: Secondary | ICD-10-CM

## 2018-12-16 DIAGNOSIS — I6300 Cerebral infarction due to thrombosis of unspecified precerebral artery: Secondary | ICD-10-CM

## 2018-12-16 DIAGNOSIS — I639 Cerebral infarction, unspecified: Secondary | ICD-10-CM

## 2018-12-16 DIAGNOSIS — I361 Nonrheumatic tricuspid (valve) insufficiency: Secondary | ICD-10-CM

## 2018-12-16 LAB — ECHOCARDIOGRAM COMPLETE
Height: 61 in
Weight: 1594.37 oz

## 2018-12-16 MED ORDER — CLOPIDOGREL BISULFATE 75 MG PO TABS: 75.0000 mg | ORAL_TABLET | Freq: Every day | ORAL | 1 refills | Status: DC

## 2018-12-16 MED ORDER — ATORVASTATIN CALCIUM 20 MG PO TABS: 20.0000 mg | ORAL_TABLET | Freq: Every day | ORAL | 1 refills | Status: AC

## 2018-12-16 MED ORDER — ENSURE ENLIVE PO LIQD
237.0000 mL | Freq: Two times a day (BID) | ORAL | Status: DC
  Administered 2018-12-16 – 2018-12-17 (×2): 237 mL via ORAL

## 2018-12-16 NOTE — Progress Notes (Signed)
CSW spoke with Patricia Gilbert at [facility]. CSW received verification that she is able to return whenever she is medically stable. She will report to room 16 and the number for report is [phone].   CSW will continue to follow and assist with discharge planning.   Domenic Schwab, MSW, [place]

## 2018-12-16 NOTE — Progress Notes (Signed)
STROKE TEAM PROGRESS NOTE   INTERVAL HISTORY Pt is awake and having echocardiogram only cooperative with limited exam. No significant neuro change from yesterday.   her TTE and LE venous doppler continue to be pending  Vitals:   12/16/18 0051 12/16/18 0444 12/16/18 0803 12/16/18 1138  BP: (!) 161/63 (!) 166/51 (!) 178/58 (!) 163/60  Pulse: (!) 109 88 91 94  Resp: 16 16 16 16   Temp: 98.4 F (36.9 C) 97.9 F (36.6 C) (!) 97.5 F (36.4 C) 97.8 F (36.6 C)  TempSrc: Oral Oral Oral Oral  SpO2: 95% 96% 96% 97%  Weight:      Height:        CBC:  Recent Labs  Lab 12/13/18 1523 12/13/18 1540  WBC 7.5  --   NEUTROABS 5.8  --   HGB 13.5 13.9  HCT 42.0 41.0  MCV 92.5  --   PLT 197  --     Basic Metabolic Panel:  Recent Labs  Lab 12/13/18 1523 12/13/18 1540  NA 135 134*  K 3.9 3.9  CL 102 101  CO2 25  --   GLUCOSE 117* 111*  BUN 12 16  CREATININE 0.64 0.60  CALCIUM 9.2  --    Lipid Panel:     Component Value Date/Time   CHOL 185 12/14/2018 0525   TRIG 62 12/14/2018 0525   HDL 67 12/14/2018 0525   CHOLHDL 2.8 12/14/2018 0525   VLDL 12 12/14/2018 0525   LDLCALC 106 (H) 12/14/2018 0525   HgbA1c:  Lab Results  Component Value Date   HGBA1C 5.2 12/14/2018   Urine Drug Screen:     Component Value Date/Time   LABOPIA NONE DETECTED 12/14/2018 0152   COCAINSCRNUR NONE DETECTED 12/14/2018 0152   LABBENZ NONE DETECTED 12/14/2018 0152   AMPHETMU NONE DETECTED 12/14/2018 0152   THCU NONE DETECTED 12/14/2018 0152   LABBARB NONE DETECTED 12/14/2018 0152    Alcohol Level     Component Value Date/Time   ETH <10 12/13/2018 1523    IMAGING  US Thyroid  Result Date: 12/15/2018 CLINICAL DATA:  Right thyroid lesion on CT EXAM: THYROID ULTRASOUND TECHNIQUE: Ultrasound examination of the thyroid gland and adjacent soft tissues was performed. COMPARISON:  CT from the previous day FINDINGS: Parenchymal Echotexture: Mildly heterogenous Isthmus: Diminutive Right lobe: 2.7 x  0.9 x 1.4 cm Left lobe: 3.4 x 0.5 x 1.6 cm _________________________________________________________ Estimated total number of nodules >/= 1 cm: 0 Number of spongiform nodules >/=  2 cm not described below (TR1): 0 Number of mixed cystic and solid nodules >/= 1.5 cm not described below (TR2): 0 _________________________________________________________ No discrete nodules are seen within the thyroid gland. IMPRESSION: 1. Small mildly heterogenous thyroid.  No discrete nodule. If there is clinical or laboratory suggestion of parathyroid adenoma, scintigraphy may be useful for further evaluation The above is in keeping with the ACR TI-RADS recommendations - J Am Coll Radiol 2017;14:587-595. Electronically Signed   By: Lucrezia Europe M.D.   On: 12/15/2018 07:55    PHYSICAL EXAM  Temp:  [97.5 F (36.4 C)-98.4 F (36.9 C)] 97.8 F (36.6 C) (06/20 1138) Pulse Rate:  [84-109] 94 (06/20 1138) Resp:  [16] 16 (06/20 1138) BP: (143-178)/(49-65) 163/60 (06/20 1138) SpO2:  [95 %-97 %] 97 % (06/20 1138)  General - Well nourished, well developed, , flat affect.  Ophthalmologic - fundi not visualized due to noncooperation.  Cardiovascular - Regular rate and rhythm.  Neuro - awake and interactive but , only  following limited verbal commands.  Able to tell me her name, however not oriented to age place or time.  Able to repeat simple sentences, naming 2/4.  Blinking to visual threat bilaterally, PERRL, no gaze deviation.  Left facial droop.  Tongue midline.  Right upper extremity 0/5, flaccid.  Left upper extremity 3/5.  Bilateral lower extremity 3-/5.  Left unsustained ankle clonus.  Babinski negative.  Sensation subjectively symmetrical, finger-to-nose not corporative.  Gait not tested   ASSESSMENT/PLAN Ms. JILLIAM BELLMORE is a 85 y.o. female with history of dementia presenting from SNF with R side weakness.   Stroke:   Multiple bilateral small anterior and posterior circulation infarcts, cardio embolic  pattern, secondary to unknown source, suspicious for occult PAF  CT head No acute stroke. Small vessel disease. Moderate atrophy.     CTA head & neck no LVO.  Left P3 occlusion otherwise patent vessels. B occipital hypodensities.   MRI  B occipital lobe ischemia alone with mult small B cerebellar, L pons, B frontal lobes, B parietal lobes and L temporal lobe infarcts. R caudate head w/ linear enhancement in the absence of malignancy felt to be a  Subacute infarct.   LE Doppler  pending  2D Echo pending   Given advanced age and dementia, only recommend 30 day monitor at d/c to look for AF as possible source of stroke  LDL 106  HgbA1c 5.2  Lovenox 30 mg sq daily for VTE prophylaxis  No antithrombotic prior to admission, now on clopidogrel 75 mg daily stroke prevention. Patient is allergic to aspirin   Therapy recommendations:  SNF - bed available when medically stable.  Disposition:  Return to SNF  Hypertension  Stable . Permissive hypertension (OK if < 220/120) but gradually normalize in 2-3 days . Long-term BP goal normotensive  Hyperlipidemia  Home meds:  No statin  Now on Lipitor 20  LDL 106, goal < 70  Continue statin at discharge  Other Stroke Risk Factors  Advanced age  ETOH use, advised to drink no more than 1 drink(s) a day  Family hx stroke (other)  Other Active Problems  Hypothyroid, on synthroid, TSH normal  Dementia, nursing home resident  CTA showed R tracheoesophageal  grove nodule - thyroid US -  no nodule seen  Hospital day # 3  Check echocardiogram and lower extremity venous Dopplers.  Patient is unlikely to be a good long-term anticoagulation candidate given history of baseline dementia and staying in a nursing home.  Continue Plavix for stroke prevention. Antony Contras, MD  To contact Stroke Continuity provider, please refer to [email or web address] After hours, contact General Neurology

## 2018-12-16 NOTE — Progress Notes (Signed)
  Echocardiogram 2D Echocardiogram has been performed.  Burnett Kanaris 12/16/2018, 11:36 AM

## 2018-12-16 NOTE — Progress Notes (Signed)
Initial Nutrition Assessment  DOCUMENTATION CODES:  Not applicable  INTERVENTION:  Given pt on NTL restricition, will order Vital shakes with all patients meals. These are naturally NTL and will not require thickening.   Given she appears to be accepting it, Will continue the Ensure Enlive po BID (needs thickened to ntl), each supplement provides 350 kcal and 20 grams of protein)   Please note, pt appears to have lost 30lbs (>20%) in past year, though there is only a paucity of data- unable to make firm conclusions.   NUTRITION DIAGNOSIS:  Inadequate oral intake related to dysphagia/dislike for D1 diet vs lethargy/confusion vs poor appetite unable to know reason for sure d/t dementia) as evidenced by meal completion < 50%.  GOAL:  Patient will meet greater than or equal to 90% of their needs  MONITOR:  Supplement acceptance, PO intake, Weight trends, Diet advancement, Labs, I & O's, Skin  REASON FOR ASSESSMENT:  Malnutrition Screening Tool    ASSESSMENT:  85 y/o female PMHx CVA, dementia, HTN. She is a nursing home resident. Recently noted to be using nondominant arm to eat w/ flaccid RUE. Sent to ED for evaluation. CT did not show cva. Admitted for workup. MRI after admit did show subacute CVA.   RD operating remotely. Despite MST results, there is no mention of any recent changes to patients oral intake in chart. However, MST results could be referring to her hospital course rather than PTA, as pt has been actual in hospital since 6/17, though mst not completed until today. Indeed, pts oral intake since admit has been poor-suboptimal. Has only had 1 meal intake >50% and several meals where she didn't eat anything. Pt was evaluated by ST and placed on a D1/NTL diet 6/18 - would not be surprised if pt eating poorly due to dislike for altered textures. Will order vital shakes to try to supplement oral intake.   In regards to her weight, chart indicates pt was ~129-130 lbs at this time  last year. Currently, she is just under 100 lbs. If past history accurate, pt has lost >20% of her BW in the past year. This meets criteria for severe malnutrition, though dont have sufficient accompanying intake history or NFPE findings to formerly dx w/ Mal.   Labs: A1c: 5.2, Na: 134, BG:110-117 Meds: Ensure ENlive- accepting. IVF  Recent Labs  Lab 12/13/18 1523 12/13/18 1540  NA 135 134*  K 3.9 3.9  CL 102 101  CO2 25  --   BUN 12 16  CREATININE 0.64 0.60  CALCIUM 9.2  --   GLUCOSE 117* 111*    NUTRITION - FOCUSED PHYSICAL EXAM: Unable to conduct  Diet Order:   Diet Order            DIET - DYS 1 Room service appropriate? No; Fluid consistency: Nectar Thick  Diet effective now             EDUCATION NEEDS:  No education needs have been identified at this time  Skin:  MASD to buttocks, abdomen  Last BM:  6/17  Height:  Ht Readings from Last 1 Encounters:  12/13/18 5\' 1"  (1.549 m)   Weight:  Wt Readings from Last 1 Encounters:  12/13/18 45.2 kg   Wt Readings from Last 10 Encounters:  12/13/18 45.2 kg  01/01/18 58.8 kg  03/12/15 57.9 kg  01/03/13 54 kg  12/24/11 47.1 kg  11/25/11 47.5 kg  08/20/11 47.5 kg   Ideal Body Weight:  47.73 kg  BMI:  Body mass index is 18.83 kg/m.  Estimated Nutritional Needs:  Kcal:  1350-1550 kcals (30-33 kcal/kg bw) Protein:  60-70g Pro (1.3-1.5g/kg bw) Fluid:  >1.1 L (25 ml/kg bw)  Burtis Junes RD, LDN, CNSC Clinical Nutrition Available Tues-Sat via Pager: [phone] 12/16/2018 1:16 PM

## 2018-12-16 NOTE — Progress Notes (Signed)
VASCULAR LAB PRELIMINARY  PRELIMINARY  PRELIMINARY  PRELIMINARY  Bilateral lower extremity venous duplex completed.    Preliminary report:  See CV proc for preliminary results.   Christo Hain, RVT 12/16/2018, 4:15 PM

## 2018-12-16 NOTE — Discharge Summary (Addendum)
Physician Discharge Summary  CHANETTA MOOSMAN RKY:[id number] DOB: Nov 18, 1925 DOA: 12/13/2018  PCP: Patient, No Pcp Per  Admit date: 12/13/2018 Discharge date 12/17/2018 Admitted From nursing home Disposition: Nursing home  Recommendations for Outpatient Follow-up:  1. Follow up with PCP in 1-2 weeks 2. Please obtain BMP/CBC in one week 3. Follow up with dr Acie Fredrickson for event monitor [place] none Equipment/Devices none  discharge Condition stable and improved  CODE STATUS: DO NOT RESUSCITATE Diet recommendation cardiac diet Brief/Interim Summary:85 y.o.femalewith medical history significant ofhypertension hypothyroidism who was sent in from the nursing home as a staff noticed right-sided weakness while at lunch. Apparently she was seen by her family this weekend and she was at her baseline. She has a history of dementia. She is able to speak and answer some of the questions. She thinks she is at the [facility] she has 2 children. She denies any headache nausea vomiting diarrhea chest pain shortness of breath cough fever chills abdominal pain.  ED Course:Sodium 134 potassium 3.9 BUN 16 creatinine 0.60 hemoglobin 13.9 platelet count 197 white count 7.5. CT of the head shows chronic ischemic changes and moderate atrophy. ED physician discussed with Dr. Malen Gauze and recommended stroke work-up. Review of Systems: As per HPI otherwise all other systems reviewed and are negative   Discharge Diagnoses:  Active Problems:   CVA (cerebral vascular accident) [facility])   #1embolicCVA with right-sided weakness-patient admitted with right upper and lower extremity weakness witnessed by the staff at the nursing home. CT scan of the head does not show any acute finding.MRI of the brain shows acute to subacute infarction within the bilateral occipital lobes as well as numerous subcentimeter foci throughout the cerebellum left pons bilateral frontal lobes bilateral parietal lobes and the left  temporal lobe. Multiple vascular territories favor embolic etiology. No hemorrhage or mass-effect. 10 mm enhancement in the right caudate head possibly subacute infarction. Severe chronic microvascular ischemic changes and moderate volume loss of the brain. Hemoglobin A1c is 5.2 LDL is 106 TSH is 3.03.  Echocardiogram The left ventricle has normal systolic function with an ejection fraction of 60-65%. The cavity size was normal. Left ventricular diastolic Doppler parameters are consistent with impaired relaxation.   The right ventricle has normal systolic function. The cavity was normal. There is no increase in right ventricular wall thickness.  Severe mitral valve prolapse.  The mitral valve is myxomatous. Mild thickening of the mitral valve leaflet. Mild calcification of the mitral valve leaflet.  The aortic valve is tricuspid. Moderate thickening of the aortic valve. Moderate calcification of the aortic valve. Aortic valve regurgitation is mild by color flow Doppler. CT angiogram of the head and neck shows patent carotid and vertebral arteries patent anterior and posterior intracranial circulation. Bilateral occipital lobe cortical hypodensity possibly areas of recent infarction or press given distribution .continue statin and plavix ..allergic to asa. Patient needs ongoing stroke work up.. PT recommends SNF  COVID negative Venous doppler lower extremity bilateral  Negative for dvt Patient started on Plavix since she is allergic to aspirin.  Neurology recommended event monitor to be placed to rule out atrial fibrillation.  However this could not be done during the weekend.  Please make sure she follows up with a cardiologist in place an event monitor as an outpatient.   Essential hypertension continue Cozaar  Hypothyroidism continue Synthroid   Nutrition Problem: Inadequate oral intake Etiology: dysphagia, lethargy/confusion, poor appetite(unable to know reason for sure d/t  dementia)    Signs/Symptoms: meal completion <  50%     Interventions: Hormel Shake  Estimated body mass index is 18.83 kg/m as calculated from the following:   Height as of this encounter: 5\' 1"  (1.549 m).   Weight as of this encounter: 45.2 kg.  Discharge Instructions   Allergies as of 12/16/2018      Reactions   Aspirin    Codeine Palpitations      Medication List    STOP taking these medications   amLODipine 10 MG tablet Commonly known as: NORVASC     TAKE these medications   acetaminophen 325 MG tablet Commonly known as: TYLENOL Take 1-2 tablets (325-650 mg total) by mouth every 6 (six) hours as needed for mild pain (pain score 1-3 or temp > 100.5).   alendronate 70 MG tablet Commonly known as: FOSAMAX Take 1 tablet (70 mg total) by mouth every 7 (seven) days. Take with a full glass of water on an empty stomach. What changed: additional instructions   atorvastatin 20 MG tablet Commonly known as: LIPITOR Take 1 tablet (20 mg total) by mouth daily at 6 PM.   clopidogrel 75 MG tablet Commonly known as: PLAVIX Take 1 tablet (75 mg total) by mouth daily. Start taking on: December 17, 2018   docusate sodium 100 MG capsule Commonly known as: COLACE Take 1 capsule (100 mg total) by mouth 2 (two) times daily. What changed: when to take this   levothyroxine 50 MCG tablet Commonly known as: SYNTHROID Take 1 tablet (50 mcg total) by mouth daily. What changed: how much to take   losartan 25 MG tablet Commonly known as: COZAAR Take 25 mg by mouth daily.   meclizine 12.5 MG tablet Commonly known as: ANTIVERT Take 12.5 mg by mouth every 4 (four) hours as needed for dizziness.   senna 8.6 MG Tabs tablet Commonly known as: SENOKOT Take 1 tablet by mouth daily as needed for mild constipation.   zinc oxide 20 % ointment Apply 1 application topically as needed for irritation (groin).       Allergies  Allergen Reactions  . Aspirin   . Codeine Palpitations     Consultations:  Neurology   Procedures/Studies: Ct Angio Head W Or Wo Contrast  Result Date: 12/13/2018 CLINICAL DATA:  85 y/o  F; right-sided weakness. EXAM: CT ANGIOGRAPHY HEAD AND NECK TECHNIQUE: Multidetector CT imaging of the head and neck was performed using the standard protocol during bolus administration of intravenous contrast. Multiplanar CT image reconstructions and MIPs were obtained to evaluate the vascular anatomy. Carotid stenosis measurements (when applicable) are obtained utilizing NASCET criteria, using the distal internal carotid diameter as the denominator. CONTRAST:  14mL OMNIPAQUE IOHEXOL 350 MG/ML SOLN COMPARISON:  12/13/2018 CT head. FINDINGS: CTA NECK FINDINGS Aortic arch: Standard branching. Imaged portion shows no evidence of aneurysm or dissection. No significant stenosis of the major arch vessel origins. Aortic calcific atherosclerosis. Right carotid system: No evidence of dissection, stenosis (50% or greater) or occlusion. Left carotid system: No evidence of dissection, stenosis (50% or greater) or occlusion. Vertebral arteries: Codominant. No evidence of dissection, stenosis (50% or greater) or occlusion. Skeleton: No acute osseous abnormality. Mild cervical spondylosis. C2-3 minimal grade 1 retrolisthesis. Other neck: Nodule within the right tracheoesophageal groove abutting posterior aspect of right lobe of thyroid measuring 14 x 11 x 11 mm (AP x ML x CC series 5, image 132 and series 9, image 85). Upper chest: Biapical pleuroparenchymal scarring. Review of the MIP images confirms the above findings CTA HEAD FINDINGS Anterior  circulation: No significant stenosis, proximal occlusion, aneurysm, or vascular malformation. Posterior circulation: No significant stenosis, proximal occlusion, aneurysm, or vascular malformation. Venous sinuses: As permitted by contrast timing, patent. Anatomic variants: Patent anterior communicating artery. No posterior communicating arteries  identified, likely hypoplastic or absent. Bilateral occipital lobe cortical hypodensities. Review of the MIP images confirms the above findings IMPRESSION: 1. Patent carotid and vertebral arteries. No dissection, aneurysm, or hemodynamically significant stenosis utilizing NASCET criteria. 2. Patent anterior and posterior intracranial circulation. No large vessel occlusion, aneurysm, or significant stenosis. 3. Bilateral occipital lobe cortical hypodensities, possibly areas of recent infarction or PRES given distribution. This can be further characterized with MRI. 4. Right tracheoesophageal groove nodule measuring 14 mm abutting posterior aspect of right lobe of thyroid. Findings may represent a thyroid nodule or parathyroid adenoma. Thyroid ultrasound is recommended on a nonemergent basis. These results were called by telephone at the time of interpretation on 12/13/2018 at 8:05 pm to Dr. Zenia Resides, who verbally acknowledged these results. Electronically Signed   By: Kristine Garbe M.D.   On: 12/13/2018 20:07   Dg Chest 1 View  Result Date: 12/13/2018 CLINICAL DATA:  Shortness of breath. Right-sided weakness. EXAM: CHEST  1 VIEW COMPARISON:  Radiograph and CT 12/30/2017 FINDINGS: Lower lung volumes from prior exam. Unchanged heart size and mediastinal contours allowing for differences in technique and lower lung volumes. No acute airspace disease, pulmonary edema, pleural fluid or pneumothorax. Calcified granuloma in the right lung. The bones are under mineralized. Excreted IV contrast in both renal collecting systems from prior head and neck CTA. IMPRESSION: Low lung volumes without acute abnormality. Electronically Signed   By: Keith Rake M.D.   On: 12/13/2018 20:25   Ct Head Wo Contrast  Result Date: 12/13/2018 CLINICAL DATA:  Altered level of consciousness. Right-sided weakness. EXAM: CT HEAD WITHOUT CONTRAST TECHNIQUE: Contiguous axial images were obtained from the base of the skull through  the vertex without intravenous contrast. COMPARISON:  CT head 12/30/2017 FINDINGS: Brain: Moderate atrophy. Moderate chronic white matter changes are stable from the prior study. Small chronic infarct left cerebellum unchanged. Negative for acute infarct, hemorrhage, or mass.  No midline shift. Vascular: Negative for hyperdense vessel Skull: Negative Sinuses/Orbits: Mucosal edema right maxillary sinus. Associated Peri dental lucency around right upper molars. No orbital lesion. Other: None IMPRESSION: No acute intracranial abnormality. Moderate atrophy and moderate chronic ischemic changes in the white matter appear stable from the prior CT. Electronically Signed   By: Franchot Gallo M.D.   On: 12/13/2018 16:59   Ct Angio Neck W Or Wo Contrast  Result Date: 12/13/2018 CLINICAL DATA:  85 y/o  F; right-sided weakness. EXAM: CT ANGIOGRAPHY HEAD AND NECK TECHNIQUE: Multidetector CT imaging of the head and neck was performed using the standard protocol during bolus administration of intravenous contrast. Multiplanar CT image reconstructions and MIPs were obtained to evaluate the vascular anatomy. Carotid stenosis measurements (when applicable) are obtained utilizing NASCET criteria, using the distal internal carotid diameter as the denominator. CONTRAST:  121mL OMNIPAQUE IOHEXOL 350 MG/ML SOLN COMPARISON:  12/13/2018 CT head. FINDINGS: CTA NECK FINDINGS Aortic arch: Standard branching. Imaged portion shows no evidence of aneurysm or dissection. No significant stenosis of the major arch vessel origins. Aortic calcific atherosclerosis. Right carotid system: No evidence of dissection, stenosis (50% or greater) or occlusion. Left carotid system: No evidence of dissection, stenosis (50% or greater) or occlusion. Vertebral arteries: Codominant. No evidence of dissection, stenosis (50% or greater) or occlusion. Skeleton: No acute osseous abnormality.  Mild cervical spondylosis. C2-3 minimal grade 1 retrolisthesis. Other  neck: Nodule within the right tracheoesophageal groove abutting posterior aspect of right lobe of thyroid measuring 14 x 11 x 11 mm (AP x ML x CC series 5, image 132 and series 9, image 85). Upper chest: Biapical pleuroparenchymal scarring. Review of the MIP images confirms the above findings CTA HEAD FINDINGS Anterior circulation: No significant stenosis, proximal occlusion, aneurysm, or vascular malformation. Posterior circulation: No significant stenosis, proximal occlusion, aneurysm, or vascular malformation. Venous sinuses: As permitted by contrast timing, patent. Anatomic variants: Patent anterior communicating artery. No posterior communicating arteries identified, likely hypoplastic or absent. Bilateral occipital lobe cortical hypodensities. Review of the MIP images confirms the above findings IMPRESSION: 1. Patent carotid and vertebral arteries. No dissection, aneurysm, or hemodynamically significant stenosis utilizing NASCET criteria. 2. Patent anterior and posterior intracranial circulation. No large vessel occlusion, aneurysm, or significant stenosis. 3. Bilateral occipital lobe cortical hypodensities, possibly areas of recent infarction or PRES given distribution. This can be further characterized with MRI. 4. Right tracheoesophageal groove nodule measuring 14 mm abutting posterior aspect of right lobe of thyroid. Findings may represent a thyroid nodule or parathyroid adenoma. Thyroid ultrasound is recommended on a nonemergent basis. These results were called by telephone at the time of interpretation on 12/13/2018 at 8:05 pm to Dr. Zenia Resides, who verbally acknowledged these results. Electronically Signed   By: Kristine Garbe M.D.   On: 12/13/2018 20:07   Mr Jeri Cos JM Contrast  Result Date: 12/13/2018 CLINICAL DATA:  85 y/o  F; right-sided weakness.  Stroke evaluation. EXAM: MRI HEAD WITHOUT AND WITH CONTRAST TECHNIQUE: Multiplanar, multiecho pulse sequences of the brain and surrounding  structures were obtained without and with intravenous contrast. CONTRAST:  5 cc Gadavist. COMPARISON:  12/13/2018 and 12/30/2017 CT head FINDINGS: Brain: Motion degradation of multiple sequences. Small (approximately 2 cm) areas of reduced diffusion within the bilateral occipital lobes corresponding to hypodensities on CT. Additionally there are numerous subcentimeter foci of reduced diffusion throughout the right greater than left cerebellar hemispheres, left hemi pons as well as scattered throughout bilateral frontal, bilateral parietal, in the left temporal lobe. Findings are compatible with acute/early subacute infarction. Multiple vascular territories favors embolic etiology. Concurrent PRES is considered less likely. Large confluent nonspecific T2 FLAIR hyperintensities in subcortical and periventricular white matter as well as the pons are compatible with severe chronic microvascular ischemic changes in similar in comparison with white matter hypodensities on prior CTs of the head. Moderate volume loss of the brain. After administration of intravenous contrast there is a linear 10 mm focus of enhancement within the right caudate head with associated T2 hyperintensity in without reduced diffusion or mass effect, series 11, image 30. No additional abnormal intracranial enhancement identified. Vascular: Normal flow voids. Skull and upper cervical spine: Normal marrow signal. Sinuses/Orbits: Moderate my maxillary sinus mucosal thickening with small fluid level. Normal aeration of mastoid air cells. Orbits are unremarkable. Other: None. IMPRESSION: Motion degraded study. 1. Small areas of reduced diffusion compatible with acute/early subacute infarction within bilateral occipital lobes as well as numerous subcentimeter foci throughout cerebellum, left pons, bilateral frontal lobes, bilateral parietal lobes, and the left temporal lobe. Multiple vascular territories favors embolic etiology, concurrent PRES is  considered less likely. No hemorrhage or mass effect. 2. 10 mm linear enhancement in right caudate head without mass effect or reduced diffusion, in the absence of primary malignancy this is probably a subacute infarction. Consider 3 month follow-up MRI of the brain with and  without contrast to ensure stability. 3. Severe chronic microvascular ischemic changes and moderate volume loss of the brain. These results will be called to the ordering clinician or representative by the Radiologist Assistant, and communication documented in the PACS or zVision Dashboard. Electronically Signed   By: Kristine Garbe M.D.   On: 12/13/2018 21:18   US Thyroid  Result Date: 12/15/2018 CLINICAL DATA:  Right thyroid lesion on CT EXAM: THYROID ULTRASOUND TECHNIQUE: Ultrasound examination of the thyroid gland and adjacent soft tissues was performed. COMPARISON:  CT from the previous day FINDINGS: Parenchymal Echotexture: Mildly heterogenous Isthmus: Diminutive Right lobe: 2.7 x 0.9 x 1.4 cm Left lobe: 3.4 x 0.5 x 1.6 cm _________________________________________________________ Estimated total number of nodules >/= 1 cm: 0 Number of spongiform nodules >/=  2 cm not described below (TR1): 0 Number of mixed cystic and solid nodules >/= 1.5 cm not described below (TR2): 0 _________________________________________________________ No discrete nodules are seen within the thyroid gland. IMPRESSION: 1. Small mildly heterogenous thyroid.  No discrete nodule. If there is clinical or laboratory suggestion of parathyroid adenoma, scintigraphy may be useful for further evaluation The above is in keeping with the ACR TI-RADS recommendations - J Am Coll Radiol 2017;14:587-595. Electronically Signed   By: Lucrezia Europe M.D.   On: 12/15/2018 07:55    (Echo, Carotid, EGD, Colonoscopy, ERCP)    Subjective: Patient is verbal anxious to go back to [facility]  Discharge Exam: Vitals:   12/16/18 0803 12/16/18 1138  BP: (!) 178/58 (!) 163/60   Pulse: 91 94  Resp: 16 16  Temp: (!) 97.5 F (36.4 C) 97.8 F (36.6 C)  SpO2: 96% 97%   Vitals:   12/16/18 0051 12/16/18 0444 12/16/18 0803 12/16/18 1138  BP: (!) 161/63 (!) 166/51 (!) 178/58 (!) 163/60  Pulse: (!) 109 88 91 94  Resp: 16 16 16 16   Temp: 98.4 F (36.9 C) 97.9 F (36.6 C) (!) 97.5 F (36.4 C) 97.8 F (36.6 C)  TempSrc: Oral Oral Oral Oral  SpO2: 95% 96% 96% 97%  Weight:      Height:        General: Pt is alert, awake, not in acute distress Cardiovascular: RRR, S1/S2 +, no rubs, no gallops Respiratory: CTA bilaterally, no wheezing, no rhonchi Abdominal: Soft, NT, ND, bowel sounds + Extremities: no edema, no cyanosis    The results of significant diagnostics from this hospitalization (including imaging, microbiology, ancillary and laboratory) are listed below for reference.     Microbiology: Recent Results (from the past 240 hour(s))  MRSA PCR Screening     Status: None   Collection Time: 12/14/18  1:40 AM   Specimen: Nasal Mucosa; Nasopharyngeal  Result Value Ref Range Status   MRSA by PCR NEGATIVE NEGATIVE Final    Comment:        The GeneXpert MRSA Assay (FDA approved for NASAL specimens only), is one component of a comprehensive MRSA colonization surveillance program. It is not intended to diagnose MRSA infection nor to guide or monitor treatment for MRSA infections. Performed at [facility], [place] [address] [place], [place] [address]   Novel Coronavirus, NAA (hospital order; send-out to ref lab)     Status: None   Collection Time: 12/14/18  2:39 PM   Specimen: Nasopharyngeal Swab; Respiratory  Result Value Ref Range Status   SARS-CoV-2, NAA NOT DETECTED NOT DETECTED Final    Comment: (NOTE) This test was developed and its performance characteristics determined by [employer]. This test  has not been FDA cleared or approved. This test has been authorized by FDA under an Emergency Use Authorization (EUA). This test is only  authorized for the duration of time the declaration that circumstances exist justifying the authorization of the emergency use of in vitro diagnostic tests for detection of SARS-CoV-2 virus and/or diagnosis of COVID-19 infection under section 564(b)(1) of the Act, 21 U.S.C. 124PYK-9(X)(8), unless the authorization is terminated or revoked sooner. When diagnostic testing is negative, the possibility of a false negative result should be considered in the context of a patient's recent exposures and the presence of clinical signs and symptoms consistent with COVID-19. An individual without symptoms of COVID-19 and who is not shedding SARS-CoV-2 virus would expect to have a negative (not detected) result in this assay. Performed  At: [facility] [address] [place], [place] [id number] Rush Farmer MD JQ:[id number]    [place]  Final    Comment: Performed at [facility], [place] [address], [place], [place] [address]     Labs: BNP (last 3 results) No results for input(s): BNP in the last 8760 hours. Basic Metabolic Panel: Recent Labs  Lab 12/13/18 1523 12/13/18 1540  NA 135 134*  K 3.9 3.9  CL 102 101  CO2 25  --   GLUCOSE 117* 111*  BUN 12 16  CREATININE 0.64 0.60  CALCIUM 9.2  --    Liver Function Tests: Recent Labs  Lab 12/13/18 1523  AST 24  ALT 14  ALKPHOS 59  BILITOT 0.5  PROT 6.9  ALBUMIN 3.5   No results for input(s): LIPASE, AMYLASE in the last 168 hours. No results for input(s): AMMONIA in the last 168 hours. CBC: Recent Labs  Lab 12/13/18 1523 12/13/18 1540  WBC 7.5  --   NEUTROABS 5.8  --   HGB 13.5 13.9  HCT 42.0 41.0  MCV 92.5  --   PLT 197  --    Cardiac Enzymes: No results for input(s): CKTOTAL, CKMB, CKMBINDEX, TROPONINI in the last 168 hours. BNP: Invalid input(s): POCBNP CBG: No results for input(s): GLUCAP in the last 168 hours. D-Dimer No results for input(s): DDIMER in the last 72  hours. Hgb A1c Recent Labs    12/14/18 0525  HGBA1C 5.2   Lipid Profile Recent Labs    12/14/18 0525  CHOL 185  HDL 67  LDLCALC 106*  TRIG 62  CHOLHDL 2.8   Thyroid function studies Recent Labs    12/14/18 0525  TSH 3.036   Anemia work up No results for input(s): VITAMINB12, FOLATE, FERRITIN, TIBC, IRON, RETICCTPCT in the last 72 hours. Urinalysis    Component Value Date/Time   COLORURINE YELLOW 12/14/2018 0152   APPEARANCEUR HAZY (A) 12/14/2018 0152   LABSPEC >1.046 (H) 12/14/2018 0152   PHURINE 5.0 12/14/2018 0152   GLUCOSEU NEGATIVE 12/14/2018 0152   HGBUR SMALL (A) 12/14/2018 0152   BILIRUBINUR NEGATIVE 12/14/2018 0152   KETONESUR 20 (A) 12/14/2018 0152   PROTEINUR NEGATIVE 12/14/2018 0152   NITRITE POSITIVE (A) 12/14/2018 0152   LEUKOCYTESUR MODERATE (A) 12/14/2018 0152   Sepsis Labs Invalid input(s): PROCALCITONIN,  WBC,  LACTICIDVEN Microbiology Recent Results (from the past 240 hour(s))  MRSA PCR Screening     Status: None   Collection Time: 12/14/18  1:40 AM   Specimen: Nasal Mucosa; Nasopharyngeal  Result Value Ref Range Status   MRSA by PCR NEGATIVE NEGATIVE Final    Comment:        The GeneXpert  MRSA Assay (FDA approved for NASAL specimens only), is one component of a comprehensive MRSA colonization surveillance program. It is not intended to diagnose MRSA infection nor to guide or monitor treatment for MRSA infections. Performed at [facility], [place] [address] [place], [place] [address]   Novel Coronavirus, NAA (hospital order; send-out to ref lab)     Status: None   Collection Time: 12/14/18  2:39 PM   Specimen: Nasopharyngeal Swab; Respiratory  Result Value Ref Range Status   SARS-CoV-2, NAA NOT DETECTED NOT DETECTED Final    Comment: (NOTE) This test was developed and its performance characteristics determined by [employer]. This test has not been FDA cleared or approved. This test has been authorized by FDA under  an Emergency Use Authorization (EUA). This test is only authorized for the duration of time the declaration that circumstances exist justifying the authorization of the emergency use of in vitro diagnostic tests for detection of SARS-CoV-2 virus and/or diagnosis of COVID-19 infection under section 564(b)(1) of the Act, 21 U.S.C. 761YJW-9(K)(9), unless the authorization is terminated or revoked sooner. When diagnostic testing is negative, the possibility of a false negative result should be considered in the context of a patient's recent exposures and the presence of clinical signs and symptoms consistent with COVID-19. An individual without symptoms of COVID-19 and who is not shedding SARS-CoV-2 virus would expect to have a negative (not detected) result in this assay. Performed  At: [facility] [address] [place], [place] [id number] Rush Farmer MD QD:[id number]    [place]  Final    Comment: Performed at [facility], [place] [address] [place], [place] [address]     Time coordinating discharge: 3 4 minutes  SIGNED:   Georgette Shell, MD  [facility] 12/16/2018, 1:37 PM Pager   If 7PM-7AM, please contact night-coverage [email or web address] Password [email or web address]

## 2018-12-16 NOTE — Plan of Care (Signed)
Progressing towards goals

## 2018-12-17 NOTE — Plan of Care (Signed)
Progressing towards goals

## 2018-12-17 NOTE — Progress Notes (Signed)
STROKE TEAM PROGRESS NOTE   INTERVAL HISTORY Pt is awake No significant neuro change from yesterday.   her TTE and LE venous doppler were both negative  Vitals:   12/16/18 2311 12/17/18 0510 12/17/18 0852 12/17/18 1242  BP: (!) 185/58 (!) 172/55 (!) 137/48 (!) 172/59  Pulse: 91 86 75 81  Resp: 18 18 18 16   Temp: 98.1 F (36.7 C) 98 F (36.7 C) 97.7 F (36.5 C) 98.2 F (36.8 C)  TempSrc: Oral Oral Axillary Axillary  SpO2: 95% 96% 97% 95%  Weight:      Height:        CBC:  Recent Labs  Lab 12/13/18 1523 12/13/18 1540  WBC 7.5  --   NEUTROABS 5.8  --   HGB 13.5 13.9  HCT 42.0 41.0  MCV 92.5  --   PLT 197  --     Basic Metabolic Panel:  Recent Labs  Lab 12/13/18 1523 12/13/18 1540  NA 135 134*  K 3.9 3.9  CL 102 101  CO2 25  --   GLUCOSE 117* 111*  BUN 12 16  CREATININE 0.64 0.60  CALCIUM 9.2  --    Lipid Panel:     Component Value Date/Time   CHOL 185 12/14/2018 0525   TRIG 62 12/14/2018 0525   HDL 67 12/14/2018 0525   CHOLHDL 2.8 12/14/2018 0525   VLDL 12 12/14/2018 0525   LDLCALC 106 (H) 12/14/2018 0525   HgbA1c:  Lab Results  Component Value Date   HGBA1C 5.2 12/14/2018   Urine Drug Screen:     Component Value Date/Time   LABOPIA NONE DETECTED 12/14/2018 0152   COCAINSCRNUR NONE DETECTED 12/14/2018 0152   LABBENZ NONE DETECTED 12/14/2018 0152   AMPHETMU NONE DETECTED 12/14/2018 0152   THCU NONE DETECTED 12/14/2018 0152   LABBARB NONE DETECTED 12/14/2018 0152    Alcohol Level     Component Value Date/Time   ETH <10 12/13/2018 1523    IMAGING  Vas [place] Lower Extremity Venous (dvt)  Result Date: 12/17/2018  Lower Venous Study Indications: Stroke.  Limitations: Constant movement of legs, moving one leg to cover the one being scanned. Comparison Study: No prior study on file for comparison. Performing Technologist: Patricia Gilbert RVS  Examination Guidelines: A complete evaluation includes B-mode imaging, spectral Doppler, color Doppler, and  power Doppler as needed of all accessible portions of each vessel. Bilateral testing is considered an integral part of a complete examination. Limited examinations for reoccurring indications may be performed as noted.  +---------+---------------+---------+-----------+----------+--------------+ RIGHT    CompressibilityPhasicitySpontaneityPropertiesSummary        +---------+---------------+---------+-----------+----------+--------------+ CFV      Full           Yes      Yes                                 +---------+---------------+---------+-----------+----------+--------------+ SFJ      Full                                                        +---------+---------------+---------+-----------+----------+--------------+ FV Prox  Full                                                        +---------+---------------+---------+-----------+----------+--------------+  FV Mid   Full                                                        +---------+---------------+---------+-----------+----------+--------------+ FV DistalFull                                                        +---------+---------------+---------+-----------+----------+--------------+ PFV      Full                                                        +---------+---------------+---------+-----------+----------+--------------+ POP                                                   Not visualized +---------+---------------+---------+-----------+----------+--------------+ PTV      Full                                                        +---------+---------------+---------+-----------+----------+--------------+ PERO                                                  Not visualized +---------+---------------+---------+-----------+----------+--------------+   Right Technical Findings: Not visualized segments include popliteal and peroneal veins.   +---------+---------------+---------+-----------+----------+--------------+ LEFT     CompressibilityPhasicitySpontaneityPropertiesSummary        +---------+---------------+---------+-----------+----------+--------------+ CFV                                                   Not visualized +---------+---------------+---------+-----------+----------+--------------+ SFJ                                                   Not visualized +---------+---------------+---------+-----------+----------+--------------+ FV Prox  Full                                                        +---------+---------------+---------+-----------+----------+--------------+ FV Mid   Full                                                        +---------+---------------+---------+-----------+----------+--------------+  FV DistalFull                                                        +---------+---------------+---------+-----------+----------+--------------+ PFV      Full                                                        +---------+---------------+---------+-----------+----------+--------------+ POP                     Yes      Yes                                 +---------+---------------+---------+-----------+----------+--------------+ PTV                                                   Not visualized +---------+---------------+---------+-----------+----------+--------------+ PERO                                                  Not visualized +---------+---------------+---------+-----------+----------+--------------+   Left Technical Findings: Not visualized segments include Common femoral, posterior tibial, and peroneal veins.   Summary: Right: There is no evidence of deep vein thrombosis in the lower extremity. However, portions of this examination were limited- see technologist comments above. Left: There is no evidence of deep vein thrombosis in the lower  extremity. However, portions of this examination were limited- see technologist comments above.  *See table(s) above for measurements and observations. Electronically signed by Monica Martinez MD on 12/17/2018 at 9:59:39 AM.    Final     PHYSICAL EXAM  Temp:  [97.7 F (36.5 C)-98.2 F (36.8 C)] 98.2 F (36.8 C) (06/21 1242) Pulse Rate:  [75-91] 81 (06/21 1242) Resp:  [16-18] 16 (06/21 1242) BP: (137-185)/(48-63) 172/59 (06/21 1242) SpO2:  [95 %-97 %] 95 % (06/21 1242)  General - Well nourished, well developed, , flat affect.  Ophthalmologic - fundi not visualized due to noncooperation.  Cardiovascular - Regular rate and rhythm.  Neuro - awake and interactive but , only following limited verbal commands.  Able to tell me her name, however not oriented to age place or time.  Able to repeat simple sentences, naming 2/4.  Blinking to visual threat bilaterally, PERRL, no gaze deviation.  Left facial droop.  Tongue midline.  Right upper extremity 0/5, flaccid.  Left upper extremity 3/5.  Bilateral lower extremity 3-/5.  Left unsustained ankle clonus.  Babinski negative.  Sensation subjectively symmetrical, finger-to-nose not corporative.  Gait not tested   ASSESSMENT/PLAN Patricia Gilbert is a 85 y.o. female with history of dementia presenting from SNF with R side weakness.   Stroke:   Multiple bilateral small anterior and posterior circulation infarcts, cardio embolic pattern, secondary to unknown source, suspicious for occult PAF  CT head No acute stroke. Small vessel disease. Moderate  atrophy.     CTA head & neck no LVO.  Left P3 occlusion otherwise patent vessels. B occipital hypodensities.   MRI  B occipital lobe ischemia alone with mult small B cerebellar, L pons, B frontal lobes, B parietal lobes and L temporal lobe infarcts. R caudate head w/ linear enhancement in the absence of malignancy felt to be a  Subacute infarct.   LE Doppler negative for DVT   2D Echo normal  ejection fraction.  Given advanced age and dementia, only recommend 30 day monitor at d/c to look for AF as possible source of stroke  LDL 106  HgbA1c 5.2  Lovenox 30 mg sq daily for VTE prophylaxis  No antithrombotic prior to admission, now on clopidogrel 75 mg daily stroke prevention. Patient is allergic to aspirin   Therapy recommendations:  SNF - bed available when medically stable.  Disposition:  Return to SNF  Hypertension  Stable . Permissive hypertension (OK if < 220/120) but gradually normalize in 2-3 days . Long-term BP goal normotensive  Hyperlipidemia  Home meds:  No statin  Now on Lipitor 20  LDL 106, goal < 70  Continue statin at discharge  Other Stroke Risk Factors  Advanced age  ETOH use, advised to drink no more than 1 drink(s) a day  Family hx stroke (other)  Other Active Problems  Hypothyroid, on synthroid, TSH normal  Dementia, nursing home resident  CTA showed R tracheoesophageal  grove nodule - thyroid US -  no nodule seen  Hospital day # 4    Patient is unlikely to be a good long-term anticoagulation candidate given history of baseline dementia and staying in a nursing home.  Continue Plavix for stroke prevention.  Stroke team will sign off.  Kindly call for questions. Antony Contras, MD  To contact Stroke Continuity provider, please refer to [email or web address] After hours, contact General Neurology

## 2018-12-17 NOTE — TOC Transition Note (Addendum)
Transition of Care [facility]) - CM/SW Discharge Note   Patient Details  Name: Patricia Gilbert MRN: [id number] Date of Birth: 15-Jul-1925  [facility] of [facility]) CM/SW Contact:  Gelene Mink, [place] Phone Number: 12/17/2018, 10:33 AM   Clinical Narrative:     Patient will DC to: [facility] Anticipated DC date: 12/17/2018 Family notified: Yes Transport by: Corey Harold, Will be called and scheduled for 1:30pm.    Per MD patient ready for DC to . RN, patient, patient's family, and facility notified of DC. Discharge Summary and FL2 sent to facility. RN to call report prior to discharge [phone]). The patient will report to room 16.  DC packet on chart. Ambulance transport requested for patient.   CSW will sign off for now as social work intervention is no longer needed. Please consult [place] again if new needs arise.  Sol Englert, LCSW-A [place]/Clinical Social Work Department Cell: [phone]     Final next level of care: [place] Barriers to Discharge: No Barriers Identified   Patient Goals and CMS Choice Patient states their goals for this hospitalization and ongoing recovery are:: Pt will return back to [facility] to resume SNF care CMS Medicare.gov Compare Post Acute Care list provided to:: Other (Comment Required)(Pt returning back to her SNF) Choice offered to / list presented to : NA  Discharge Placement   Existing PASRR number confirmed : 12/17/18          Patient [facility] bed at: [facility] Patient to be transferred to facility by: [facility] Name of family member notified: Ruthann Cancer and Tom Patient and family notified of of transfer: 12/17/18  Discharge Plan and Services                                     Social Determinants of Health (SDOH) Interventions     Readmission Risk Interventions No flowsheet data found.

## 2018-12-17 NOTE — Progress Notes (Signed)
Patient being discharged to [facility]. Report called to Opal Sidles.  IV removed with the catheter intact. Discharge instructions and prescription information given. Patient to be transported by [facility].

## 2018-12-18 ENCOUNTER — Telehealth: Payer: Self-pay | Admitting: Cardiovascular Disease

## 2018-12-18 NOTE — Telephone Encounter (Signed)
[place] called to schedule an Event Monitor for the patient . Pls call and advise. Patient is a long term resident at [facility] [address] [place] [phone]- ask for the patient's nurse.

## 2018-12-18 NOTE — Telephone Encounter (Signed)
Called [phone] and spoke to patient's nurse Joy at [facility]. She states that the patient was recently discharged from the hospital and instructed to schedule a follow up appointment with Cardiology to arrange for an event monitor. Telephone appointment made with Dr. Acie Fredrickson on 6/24 at 8:20 AM. Will need to call [phone] and ask to speak to her nurse for the rooming call (vitals/meds/etc.) and MD can call the patient's room at [phone] for the telephone visit.      Virtual Visit Pre-Appointment Phone Call  TELEPHONE CALL NOTE  DVORA BUITRON has been deemed a candidate for a follow-up tele-health visit to limit community exposure during the Covid-19 pandemic. I spoke with the patient via phone to ensure availability of phone/video source, confirm preferred email & phone number, and discuss instructions and expectations.  I reminded [name] to be prepared with any vital sign and/or heart rhythm information that could potentially be obtained via home monitoring, at the time of her visit. I reminded DENALY GATLING to expect a phone call prior to her visit.  Joy, patient's nurse at [facility] states that patient consents to information below.  Cleon Gustin, RN 12/18/2018 12:05 PM   FULL LENGTH CONSENT FOR TELE-HEALTH VISIT   I hereby voluntarily request, consent and authorize [facility] and its employed or contracted physicians, physician assistants, nurse practitioners or other licensed health care professionals (the Practitioner), to provide me with telemedicine health care services (the "Services") as deemed necessary by the treating Practitioner. I acknowledge and consent to receive the Services by the Practitioner via telemedicine. I understand that the telemedicine visit will involve communicating with the Practitioner through live audiovisual communication technology and the disclosure of certain medical information by electronic  transmission. I acknowledge that I have been given the opportunity to request an in-person assessment or other available alternative prior to the telemedicine visit and am voluntarily participating in the telemedicine visit.  I understand that I have the right to withhold or withdraw my consent to the use of telemedicine in the course of my care at any time, without affecting my right to future care or treatment, and that the Practitioner or I may terminate the telemedicine visit at any time. I understand that I have the right to inspect all information obtained and/or recorded in the course of the telemedicine visit and may receive copies of available information for a reasonable fee.  I understand that some of the potential risks of receiving the Services via telemedicine include:  Marland Kitchen Delay or interruption in medical evaluation due to technological equipment failure or disruption; . Information transmitted may not be sufficient (e.g. poor resolution of images) to allow for appropriate medical decision making by the Practitioner; and/or  . In rare instances, security protocols could fail, causing a breach of personal health information.  Furthermore, I acknowledge that it is my responsibility to provide information about my medical history, conditions and care that is complete and accurate to the best of my ability. I acknowledge that Practitioner's advice, recommendations, and/or decision may be based on factors not within their control, such as incomplete or inaccurate data provided by me or distortions of diagnostic images or specimens that may result from electronic transmissions. I understand that the practice of medicine is not an exact science and that Practitioner makes no warranties or guarantees regarding treatment outcomes. I acknowledge that I will receive a copy of this consent concurrently upon execution via email to the email address I  last provided but may also request a printed copy by  calling the office of [place].    I understand that my insurance will be billed for this visit.   I have read or had this consent read to me. . I understand the contents of this consent, which adequately explains the benefits and risks of the Services being provided via telemedicine.  . I have been provided ample opportunity to ask questions regarding this consent and the Services and have had my questions answered to my satisfaction. . I give my informed consent for the services to be provided through the use of telemedicine in my medical care  By participating in this telemedicine visit I agree to the above.

## 2018-12-20 ENCOUNTER — Telehealth (INDEPENDENT_AMBULATORY_CARE_PROVIDER_SITE_OTHER): Payer: Medicare Other | Admitting: Cardiovascular Disease

## 2018-12-20 ENCOUNTER — Encounter: Payer: Self-pay | Admitting: Cardiovascular Disease

## 2018-12-20 ENCOUNTER — Other Ambulatory Visit: Payer: Self-pay

## 2018-12-20 VITALS — BP 112/70 | HR 70 | Ht 61.0 in | Wt 99.0 lb

## 2018-12-20 DIAGNOSIS — I10 Essential (primary) hypertension: Secondary | ICD-10-CM

## 2018-12-20 DIAGNOSIS — I6300 Cerebral infarction due to thrombosis of unspecified precerebral artery: Secondary | ICD-10-CM | POA: Diagnosis not present

## 2018-12-20 DIAGNOSIS — Z7189 Other specified counseling: Secondary | ICD-10-CM

## 2018-12-20 NOTE — Patient Instructions (Signed)
Medication Instructions:   If you need a refill on your cardiac medications before your next appointment, please call your pharmacy.   Lab work:  If you have labs (blood work) drawn today and your tests are completely normal, you will receive your results only by: Marland Kitchen MyChart Message (if you have MyChart) OR . A paper copy in the mail If you have any lab test that is abnormal or we need to change your treatment, we will call you to review the results.  Testing/Procedures: Your physician has recommended that you wear a Zio Patch Monitor for 3 to 4 weeks. Zio monitors are medical devices that record the heart's electrical activity. Doctors most often [place] these monitors to diagnose arrhythmias. Arrhythmias are problems with the speed or rhythm of the heartbeat. The monitor is a small, portable device. You can wear one while you do your normal daily activities. This is usually used to diagnose what is causing palpitations/syncope (passing out).  Follow-Up: At [facility], you and your health needs are our priority.  As part of our continuing mission to provide you with exceptional heart care, we have created designated Provider Care Teams.  These Care Teams include your primary Cardiologist (physician) and Advanced Practice Providers (APPs -  Physician Assistants and Nurse Practitioners) who all work together to provide you with the care you need, when you need it. You will need a follow up appointment in:  6 months.  Please call our office 2 months in advance to schedule this appointment.  You may see Dr. Acie Fredrickson or one of the following Advanced Practice Providers on your designated Care Team: Richardson Dopp, PA-C [place], [place] . Daune Perch, NP

## 2018-12-20 NOTE — Progress Notes (Signed)
.    Virtual Visit via Telephone Note   This visit type was conducted due to national recommendations for restrictions regarding the COVID-19 Pandemic (e.g. social distancing) in an effort to limit this patient's exposure and mitigate transmission in our community.  Due to her co-morbid illnesses, this patient is at least at moderate risk for complications without adequate follow up.  This format is felt to be most appropriate for this patient at this time.  The patient did not have access to video technology/had technical difficulties with video requiring transitioning to audio format only (telephone).  All issues noted in this document were discussed and addressed.  No physical exam could be performed with this format.  Please refer to the patient's chart for her  consent to telehealth for [employer].   Date:  12/20/2018   ID:  Patricia Gilbert, DOB [birth year], MRN [id number]  Patient Location: [place] Provider Location: Home  PCP:  Patient, No Pcp Per  Cardiologist:  New, Patricia Gilbert  Electrophysiologist:  None   Evaluation Performed:  New Patient Evaluation  Chief Complaint:  CVA   December 20, 2018    Patricia Gilbert is a 85 y.o. female with a history of hypertension, hypothyroidism who is a resident of a skilled nursing facility.  She was admitted in mid June, 2020 with right-sided weakness.  She has a history of dementia.  MRI of the brain reveals acute/subacute infarction of the bilateral occipital lobes, numerous subsegmental foci throughout the cerebellum, left pons, bilateral frontal lobes, bilateral parietal lobes, and left temporal lobe. Echocardiogram reveals normal left ventricular systolic function.  There was severe mitral valve prolapse.  She has an aspirin allergy so she was started on Plavix.  The plan was to place an event monitor to evaluate her for possible atrial fibrillation.  Assisted by Patricia Gilbert - [place] in [place] [employer] [place]. Assist with  eating and mobilization  from nursing staff Limited history and details from patient Mostly from nurse    The patient does not have symptoms concerning for COVID-19 infection (fever, chills, cough, or new shortness of breath).    Past Medical History:  Diagnosis Date  . Allergic rhinitis, cause unspecified   . Hypothyroid   . Melanoma ([place]) 1997  . Sinus tachycardia   . Syncope 08/20/2011   single event, neg echo and cards eval    Past Surgical History:  Procedure Laterality Date  . CESAREAN SECTION     [id number]  . HIP PINNING,CANNULATED Left 12/31/2017   Procedure: CANNULATED HIP PINNING;  Surgeon: Mcarthur Rossetti, MD;  Location: [facility];  Service: Orthopedics;  Laterality: Left;  Marland Kitchen MELANOMA EXCISION  1997     Current Meds  Medication Sig  . acetaminophen (TYLENOL) 325 MG tablet Take 1-2 tablets (325-650 mg total) by mouth every 6 (six) hours as needed for mild pain (pain score 1-3 or temp > 100.5).  Marland Kitchen alendronate (FOSAMAX) 70 MG tablet Take 1 tablet (70 mg total) by mouth every 7 (seven) days. Take with a full glass of water on an empty stomach. (Patient taking differently: Take 70 mg by mouth every 7 (seven) days. Take with a full glass of water on an empty stomach. Thursday)  . atorvastatin (LIPITOR) 20 MG tablet Take 1 tablet (20 mg total) by mouth daily at 6 PM.  . clopidogrel (PLAVIX) 75 MG tablet Take 1 tablet (75 mg total) by mouth daily.  Marland Kitchen docusate sodium (COLACE) 100 MG capsule Take 1 capsule (  100 mg total) by mouth 2 (two) times daily.  Marland Kitchen levothyroxine (SYNTHROID, LEVOTHROID) 50 MCG tablet Take 1 tablet (50 mcg total) by mouth daily.  Marland Kitchen losartan (COZAAR) 25 MG tablet Take 25 mg by mouth daily.  . meclizine (ANTIVERT) 12.5 MG tablet Take 12.5 mg by mouth every 4 (four) hours as needed for dizziness.  . senna (SENOKOT) 8.6 MG TABS tablet Take 1 tablet by mouth daily as needed for mild constipation.  Marland Kitchen zinc oxide 20 % ointment Apply 1 application topically as  needed for irritation (groin).     Allergies:   Aspirin, Lactose intolerance (gi), and Codeine   Social History   Tobacco Use  . Smoking status: Never Smoker  . Smokeless tobacco: Never Used  Substance Use Topics  . Alcohol use: Yes  . Drug use: No     Family Hx: The patient's family history includes Diabetes in an other family member; Prostate cancer in her father; Stroke in an other family member.  ROS:   Please see the history of present illness.     All other systems reviewed and are negative.   Prior CV studies:   The following studies were reviewed today:    Labs/Other Tests and Data Reviewed:    EKG:  No ECG reviewed.  Recent Labs: 12/13/2018: ALT 14; BUN 16; Creatinine, Ser 0.60; Hemoglobin 13.9; Platelets 197; Potassium 3.9; Sodium 134 12/14/2018: TSH 3.036   Recent Lipid Panel Lab Results  Component Value Date/Time   CHOL 185 12/14/2018 05:25 AM   TRIG 62 12/14/2018 05:25 AM   HDL 67 12/14/2018 05:25 AM   CHOLHDL 2.8 12/14/2018 05:25 AM   LDLCALC 106 (H) 12/14/2018 05:25 AM   LDLDIRECT 159.3 01/03/2013 10:01 AM    Wt Readings from Last 3 Encounters:  12/20/18 99 lb (44.9 kg)  12/13/18 99 lb 10.4 oz (45.2 kg)  01/01/18 129 lb 10.1 oz (58.8 kg)     Objective:    Vital Signs:  BP 112/70   Pulse 70   Ht 5\' 1"  (1.549 m)   Wt 99 lb (44.9 kg)   BMI 18.71 kg/m    No exam - telephone visit  ASSESSMENT & PLAN:    1. CVA : [name] seems to be doing well following her recent hospitalization for stroke.  The MRI revealed multiple strokes.  This is suggestive that she may have paroxysmal atrial fibrillation.  We will be sending out a ZIO patch monitor for further evaluation of her atrial fibrillation.  Her vital signs look stable and we should continue with her current medications.  Continue Plavix for now.  We will change her to Eliquis if she is found to have atrial fibrillation.  COVID-19 Education: The signs and symptoms of COVID-19 were  discussed with the patient and how to seek care for testing (follow up with PCP or arrange E-visit).  The importance of social distancing was discussed today.  Time:   Today, I have spent 31 minutes with the patient with telehealth technology discussing the above problems.     Medication Adjustments/Labs and Tests Ordered: Current medicines are reviewed at length with the patient today.  Concerns regarding medicines are outlined above.   Tests Ordered: Orders Placed This Encounter  Procedures  . CARDIAC EVENT MONITOR    Medication Changes: No orders of the defined types were placed in this encounter.   Follow Up:  Virtual Visit or In Person in 6 month(s)  Signed, Mertie Moores, MD  12/20/2018 10:30 AM  [facility]

## 2018-12-29 ENCOUNTER — Ambulatory Visit (INDEPENDENT_AMBULATORY_CARE_PROVIDER_SITE_OTHER): Payer: Medicare Other

## 2018-12-29 DIAGNOSIS — I480 Paroxysmal atrial fibrillation: Secondary | ICD-10-CM | POA: Diagnosis not present

## 2018-12-29 DIAGNOSIS — I10 Essential (primary) hypertension: Secondary | ICD-10-CM | POA: Diagnosis not present

## 2018-12-29 DIAGNOSIS — I639 Cerebral infarction, unspecified: Secondary | ICD-10-CM

## 2018-12-29 DIAGNOSIS — I6300 Cerebral infarction due to thrombosis of unspecified precerebral artery: Secondary | ICD-10-CM | POA: Diagnosis not present

## 2019-02-06 ENCOUNTER — Other Ambulatory Visit: Payer: Self-pay | Admitting: Cardiovascular Disease

## 2019-02-06 DIAGNOSIS — I639 Cerebral infarction, unspecified: Secondary | ICD-10-CM

## 2019-02-06 DIAGNOSIS — I6300 Cerebral infarction due to thrombosis of unspecified precerebral artery: Secondary | ICD-10-CM

## 2019-02-06 DIAGNOSIS — I10 Essential (primary) hypertension: Secondary | ICD-10-CM

## 2019-02-06 DIAGNOSIS — I480 Paroxysmal atrial fibrillation: Secondary | ICD-10-CM

## 2019-02-20 ENCOUNTER — Telehealth: Payer: Self-pay | Admitting: Cardiovascular Disease

## 2019-02-20 NOTE — Telephone Encounter (Signed)
° °  Please return call to son  Marcello Moores with monitor results

## 2019-02-20 NOTE — Telephone Encounter (Signed)
DPR ok to s/w pt's son Gershon Mussel who has been notified of monitor results for the pt by phone with verbal understanding. Pt's son thanked me for the call. Advised continue current Tx plan. The patient has been notified of the result and verbalized understanding.  All questions (if any) were answered. Julaine Hua, [facility] 02/20/2019 3:34 PM

## 2019-02-22 ENCOUNTER — Other Ambulatory Visit: Payer: Self-pay

## 2019-02-22 ENCOUNTER — Encounter (HOSPITAL_COMMUNITY): Payer: Self-pay

## 2019-02-22 ENCOUNTER — Inpatient Hospital Stay (HOSPITAL_COMMUNITY)
Admission: EM | Admit: 2019-02-22 | Discharge: 2019-02-26 | DRG: 812 | Disposition: A | Discharge status: Skilled Nursing Facility | Payer: Medicare Other | Source: Skilled Nursing Facility | Attending: Internal Medicine | Admitting: Internal Medicine

## 2019-02-22 DIAGNOSIS — Z7902 Long term (current) use of antithrombotics/antiplatelets: Secondary | ICD-10-CM | POA: Diagnosis not present

## 2019-02-22 DIAGNOSIS — T45525A Adverse effect of antithrombotic drugs, initial encounter: Secondary | ICD-10-CM | POA: Diagnosis present

## 2019-02-22 DIAGNOSIS — Z66 Do not resuscitate: Secondary | ICD-10-CM | POA: Diagnosis present

## 2019-02-22 DIAGNOSIS — Z886 Allergy status to analgesic agent status: Secondary | ICD-10-CM

## 2019-02-22 DIAGNOSIS — Z20828 Contact with and (suspected) exposure to other viral communicable diseases: Secondary | ICD-10-CM | POA: Diagnosis present

## 2019-02-22 DIAGNOSIS — B9689 Other specified bacterial agents as the cause of diseases classified elsewhere: Secondary | ICD-10-CM | POA: Diagnosis present

## 2019-02-22 DIAGNOSIS — Z85820 Personal history of malignant melanoma of skin: Secondary | ICD-10-CM | POA: Diagnosis not present

## 2019-02-22 DIAGNOSIS — N390 Urinary tract infection, site not specified: Secondary | ICD-10-CM | POA: Diagnosis present

## 2019-02-22 DIAGNOSIS — Z8673 Personal history of transient ischemic attack (TIA), and cerebral infarction without residual deficits: Secondary | ICD-10-CM | POA: Diagnosis not present

## 2019-02-22 DIAGNOSIS — R8271 Bacteriuria: Secondary | ICD-10-CM

## 2019-02-22 DIAGNOSIS — Z79899 Other long term (current) drug therapy: Secondary | ICD-10-CM | POA: Diagnosis not present

## 2019-02-22 DIAGNOSIS — F0390 Unspecified dementia without behavioral disturbance: Secondary | ICD-10-CM | POA: Diagnosis present

## 2019-02-22 DIAGNOSIS — E739 Lactose intolerance, unspecified: Secondary | ICD-10-CM | POA: Diagnosis present

## 2019-02-22 DIAGNOSIS — Z7983 Long term (current) use of bisphosphonates: Secondary | ICD-10-CM

## 2019-02-22 DIAGNOSIS — K922 Gastrointestinal hemorrhage, unspecified: Secondary | ICD-10-CM | POA: Diagnosis not present

## 2019-02-22 DIAGNOSIS — K921 Melena: Secondary | ICD-10-CM | POA: Diagnosis present

## 2019-02-22 DIAGNOSIS — Z823 Family history of stroke: Secondary | ICD-10-CM | POA: Diagnosis not present

## 2019-02-22 DIAGNOSIS — D62 Acute posthemorrhagic anemia: Secondary | ICD-10-CM | POA: Diagnosis present

## 2019-02-22 DIAGNOSIS — R64 Cachexia: Secondary | ICD-10-CM | POA: Diagnosis present

## 2019-02-22 DIAGNOSIS — D6832 Hemorrhagic disorder due to extrinsic circulating anticoagulants: Secondary | ICD-10-CM | POA: Diagnosis present

## 2019-02-22 DIAGNOSIS — E861 Hypovolemia: Secondary | ICD-10-CM | POA: Diagnosis present

## 2019-02-22 DIAGNOSIS — E039 Hypothyroidism, unspecified: Secondary | ICD-10-CM | POA: Diagnosis present

## 2019-02-22 DIAGNOSIS — M810 Age-related osteoporosis without current pathological fracture: Secondary | ICD-10-CM | POA: Diagnosis present

## 2019-02-22 DIAGNOSIS — I10 Essential (primary) hypertension: Secondary | ICD-10-CM | POA: Diagnosis present

## 2019-02-22 DIAGNOSIS — E876 Hypokalemia: Secondary | ICD-10-CM

## 2019-02-22 DIAGNOSIS — E870 Hyperosmolality and hypernatremia: Secondary | ICD-10-CM | POA: Diagnosis present

## 2019-02-22 DIAGNOSIS — Z885 Allergy status to narcotic agent status: Secondary | ICD-10-CM

## 2019-02-22 DIAGNOSIS — D649 Anemia, unspecified: Secondary | ICD-10-CM | POA: Diagnosis present

## 2019-02-22 DIAGNOSIS — Z79890 Hormone replacement therapy (postmenopausal): Secondary | ICD-10-CM | POA: Diagnosis not present

## 2019-02-22 DIAGNOSIS — Z681 Body mass index (BMI) 19 or less, adult: Secondary | ICD-10-CM

## 2019-02-22 DIAGNOSIS — Z8042 Family history of malignant neoplasm of prostate: Secondary | ICD-10-CM | POA: Diagnosis not present

## 2019-02-22 DIAGNOSIS — Z9889 Other specified postprocedural states: Secondary | ICD-10-CM | POA: Diagnosis not present

## 2019-02-22 LAB — URINALYSIS, ROUTINE W REFLEX MICROSCOPIC
Bilirubin Urine: NEGATIVE
Glucose, UA: NEGATIVE mg/dL
Ketones, ur: NEGATIVE mg/dL
Nitrite: NEGATIVE
Protein, ur: NEGATIVE mg/dL
Specific Gravity, Urine: 1.023 (ref 1.005–1.030)
pH: 5 (ref 5.0–8.0)

## 2019-02-22 LAB — CBC WITH DIFFERENTIAL/PLATELET
Abs Immature Granulocytes: 0.17 10*3/uL — ABNORMAL HIGH (ref 0.00–0.07)
Basophils Absolute: 0 10*3/uL (ref 0.0–0.1)
Basophils Relative: 0 %
Eosinophils Absolute: 0 10*3/uL (ref 0.0–0.5)
Eosinophils Relative: 0 %
HCT: 19.9 % — ABNORMAL LOW (ref 36.0–46.0)
Hemoglobin: 6.1 g/dL — CL (ref 12.0–15.0)
Immature Granulocytes: 1 %
Lymphocytes Relative: 13 %
Lymphs Abs: 1.7 10*3/uL (ref 0.7–4.0)
MCH: 29.8 pg (ref 26.0–34.0)
MCHC: 30.7 g/dL (ref 30.0–36.0)
MCV: 97.1 fL (ref 80.0–100.0)
Monocytes Absolute: 0.7 10*3/uL (ref 0.1–1.0)
Monocytes Relative: 5 %
Neutro Abs: 10.2 10*3/uL — ABNORMAL HIGH (ref 1.7–7.7)
Neutrophils Relative %: 81 %
Platelets: 203 10*3/uL (ref 150–400)
RBC: 2.05 MIL/uL — ABNORMAL LOW (ref 3.87–5.11)
RDW: 15.2 % (ref 11.5–15.5)
WBC: 12.7 10*3/uL — ABNORMAL HIGH (ref 4.0–10.5)
nRBC: 0.2 % (ref 0.0–0.2)

## 2019-02-22 LAB — COMPREHENSIVE METABOLIC PANEL
ALT: 13 U/L (ref 0–44)
AST: 30 U/L (ref 15–41)
Albumin: 2.6 g/dL — ABNORMAL LOW (ref 3.5–5.0)
Alkaline Phosphatase: 45 U/L (ref 38–126)
Anion gap: 12 (ref 5–15)
BUN: 43 mg/dL — ABNORMAL HIGH (ref 8–23)
CO2: 23 mmol/L (ref 22–32)
Calcium: 8.6 mg/dL — ABNORMAL LOW (ref 8.9–10.3)
Chloride: 110 mmol/L (ref 98–111)
Creatinine, Ser: 0.71 mg/dL (ref 0.44–1.00)
GFR calc Af Amer: >60 mL/min (ref >60)
GFR calc non Af Amer: >60 mL/min (ref >60)
Glucose, Bld: 158 mg/dL — ABNORMAL HIGH (ref 70–99)
Potassium: 2.9 mmol/L — ABNORMAL LOW (ref 3.5–5.1)
Sodium: 145 mmol/L (ref 135–145)
Total Bilirubin: 0.6 mg/dL (ref 0.3–1.2)
Total Protein: 5.1 g/dL — ABNORMAL LOW (ref 6.5–8.1)

## 2019-02-22 LAB — SAMPLE TO BLOOD BANK

## 2019-02-22 LAB — LACTIC ACID, PLASMA
Lactic Acid, Venous: 3.4 mmol/L (ref 0.5–1.9)
Lactic Acid, Venous: 1.8 mmol/L (ref 0.5–1.9)

## 2019-02-22 LAB — SARS CORONAVIRUS 2 BY RT PCR (HOSPITAL ORDER, PERFORMED IN ~~LOC~~ HOSPITAL LAB): SARS Coronavirus 2: NEGATIVE

## 2019-02-22 LAB — PROTIME-INR
INR: 1.2 (ref 0.8–1.2)
Prothrombin Time: 14.6 seconds (ref 11.4–15.2)

## 2019-02-22 LAB — POC OCCULT BLOOD, ED: Fecal Occult Bld: POSITIVE — AB

## 2019-02-22 LAB — PREPARE RBC (CROSSMATCH)

## 2019-02-22 LAB — ABO/RH: ABO/RH(D): A NEG

## 2019-02-22 MED ORDER — SODIUM CHLORIDE 0.9 % IV BOLUS
500.0000 mL | Freq: Once | INTRAVENOUS | Status: AC
  Administered 2019-02-22: 500 mL via INTRAVENOUS

## 2019-02-22 MED ORDER — POTASSIUM CHLORIDE 10 MEQ/100ML IV SOLN
10.0000 meq | INTRAVENOUS | Status: AC
  Administered 2019-02-22 – 2019-02-23 (×6): 10 meq via INTRAVENOUS
  Filled 2019-02-22 (×6): qty 100

## 2019-02-22 MED ORDER — SODIUM CHLORIDE 0.9 % IV SOLN
1.0000 g | Freq: Once | INTRAVENOUS | Status: AC
  Administered 2019-02-22: 1 g via INTRAVENOUS
  Filled 2019-02-22: qty 10

## 2019-02-22 MED ORDER — ZINC OXIDE 20 % EX OINT
1.0000 "application " | TOPICAL_OINTMENT | Freq: Two times a day (BID) | CUTANEOUS | Status: DC
  Administered 2019-02-23 – 2019-02-26 (×8): 1 via TOPICAL
  Filled 2019-02-22: qty 28.35

## 2019-02-22 MED ORDER — SODIUM CHLORIDE 0.9 % IV SOLN
8.0000 mg/h | INTRAVENOUS | Status: DC
  Administered 2019-02-22: 8 mg/h via INTRAVENOUS
  Filled 2019-02-22 (×2): qty 80

## 2019-02-22 MED ORDER — SODIUM CHLORIDE 0.9 % IV SOLN
80.0000 mg | Freq: Once | INTRAVENOUS | Status: AC
  Administered 2019-02-22: 80 mg via INTRAVENOUS
  Filled 2019-02-22: qty 80

## 2019-02-22 MED ORDER — PANTOPRAZOLE SODIUM 40 MG IV SOLR: 40.0000 mg | Freq: Two times a day (BID) | INTRAVENOUS | Status: DC

## 2019-02-22 MED ORDER — SODIUM CHLORIDE 0.9 % IV SOLN
INTRAVENOUS | Status: AC
  Administered 2019-02-22 (×2): via INTRAVENOUS

## 2019-02-22 MED ORDER — SODIUM CHLORIDE 0.9 % IV SOLN
10.0000 mL/h | Freq: Once | INTRAVENOUS | Status: AC
  Administered 2019-02-22: 18:00:00 10 mL/h via INTRAVENOUS

## 2019-02-22 MED ORDER — SODIUM CHLORIDE 0.9% FLUSH
3.0000 mL | Freq: Two times a day (BID) | INTRAVENOUS | Status: DC
  Administered 2019-02-22 – 2019-02-26 (×6): 3 mL via INTRAVENOUS

## 2019-02-22 MED ORDER — SODIUM CHLORIDE 0.9 % IV SOLN
1.0000 g | INTRAVENOUS | Status: DC
  Administered 2019-02-23 – 2019-02-24 (×2): 1 g via INTRAVENOUS
  Filled 2019-02-22 (×2): qty 10

## 2019-02-22 NOTE — ED Triage Notes (Signed)
Pt BIB [facility] for eval of GI bleeding onset last evening. EMS reports facility noted dark and tarry stools since last night, today noted pt is more lethargic and less responsive. Facility reports dementia at baseline but states she is typically a little more alert and "feisty". Pt appears lethargic on arrival, responsive to voice.

## 2019-02-22 NOTE — H&P (Addendum)
Date: 02/22/2019               Patient Name:  Patricia Gilbert MRN: [id number]  DOB: [birth year] Age / Sex: 85 y.o., female   PCP: Patient, No Pcp Per         Medical Service: Internal Medicine Teaching Service         Attending Physician: Dr. Lucious Groves, DO    First Contact: Dr. Gilford Rile Pager: [phone]  Second Contact: Dr. Tarri Abernethy Pager: [phone]       After Hours (After 5p/  First Contact Pager: [phone]  weekends / holidays): Second Contact Pager: [phone]   Chief Complaint: Dark stools, Tachy Cardia  History of Present Illness: Patricia Gilbert is a 85 yo F with Hx of Mild dementia, CVA, HTN, and Hypothyroidism who presented from her facility with tachycardia and recent dark stools. HPI obtained via chart review as patient is pleasantly demented and unable to provide significant history. When asked patient states she feels alright and denies any recent symptoms. Report from her facility showed tachycardia and at least 1 day of dark tarry stools. She was also noted to be more lethargic and less responsive than her baseline. She remained tachy cardiac in ED and Hgb was found to be 6.1 (down from ~13.9 2 months ago) and FOBT was positive. She was also noted to have a leukocytosis at 12.7 and U/A consistent with UTI. Her facility documentation did show similar U/A and positive Urine Culture showing >100,000 GNRs. Her Lab work in the ED was also significant for BUN of 43, Protein 5.1, and Albumin 2.6 EKG showed sinus tachycardia. GI was consulted in the ED. Patient will be admitted for further workup and care.   Meds:  Current Meds  Medication Sig  . acetaminophen (TYLENOL) 325 MG tablet Take 1-2 tablets (325-650 mg total) by mouth every 6 (six) hours as needed for mild pain (pain score 1-3 or temp > 100.5).  Marland Kitchen alendronate (FOSAMAX) 70 MG tablet Take 1 tablet (70 mg total) by mouth every 7 (seven) days. Take with a full glass of water on an empty stomach. (Patient taking differently: Take  70 mg by mouth every 7 (seven) days. Take with a full glass of water on an empty stomach. Thursday)  . atorvastatin (LIPITOR) 20 MG tablet Take 1 tablet (20 mg total) by mouth daily at 6 PM. (Patient taking differently: Take 20 mg by mouth daily. )  . busPIRone (BUSPAR) 7.5 MG tablet Take 7.5 mg by mouth 2 (two) times daily.  . clopidogrel (PLAVIX) 75 MG tablet Take 1 tablet (75 mg total) by mouth daily.  Marland Kitchen levothyroxine (SYNTHROID, LEVOTHROID) 50 MCG tablet Take 1 tablet (50 mcg total) by mouth daily.  Marland Kitchen losartan (COZAAR) 25 MG tablet Take 25 mg by mouth daily.  . miconazole (MONISTAT 7) 2 % vaginal cream Place 1 Applicatorful vaginally at bedtime.  . senna (SENOKOT) 8.6 MG TABS tablet Take 2 tablets by mouth daily.   . sertraline (ZOLOFT) 25 MG tablet Take 25 mg by mouth daily.  Marland Kitchen zinc oxide 20 % ointment Apply 1 application topically See admin instructions. Apply to Groin every shift  . zinc sulfate 220 (50 Zn) MG capsule Take 220 mg by mouth daily.     Allergies: Allergies as of 02/22/2019 - Review Complete 02/22/2019  Allergen Reaction Noted  . Aspirin  08/20/2011  . Lactose intolerance (gi)  12/20/2018  . Codeine Palpitations 06/18/2014   Past Medical History:  Diagnosis Date  . Allergic rhinitis, cause unspecified   . Hypothyroid   . Melanoma ([place]) 1997  . Sinus tachycardia   . Syncope 08/20/2011   single event, neg echo and cards eval     Family History:  Family History  Problem Relation Age of Onset  . Prostate cancer Father   . Diabetes Other   . Stroke Other   Patient unable to confirm on admission  Social History:  Social History   Tobacco Use  . Smoking status: Never Smoker  . Smokeless tobacco: Never Used  Substance Use Topics  . Alcohol use: Yes  . Drug use: No  Patient unable to confirm on admission  Review of Systems: A complete ROS was negative except as per HPI.  Physical Exam: Blood pressure (!) 147/51, pulse 95, temperature 99 F (37.2 C),  temperature source Rectal, resp. rate 15, height 5\' 1"  (1.549 m), weight 45 kg, SpO2 96 %. Physical Exam Constitutional:      General: She is not in acute distress.    Appearance: Normal appearance.     Comments: Elderly, frail, thin female  HENT:     Head: Normocephalic and atraumatic.  Eyes:     General:        Right eye: Discharge present.        Left eye: Discharge present.    Pupils: Pupils are equal, round, and reactive to light.  Cardiovascular:     Rate and Rhythm: Regular rhythm. Tachycardia present.     Pulses: Normal pulses.     Heart sounds: Normal heart sounds.  Pulmonary:     Effort: Pulmonary effort is normal. No respiratory distress.     Breath sounds: Normal breath sounds.  Abdominal:     General: Bowel sounds are normal. There is no distension.     Palpations: Abdomen is soft.     Tenderness: There is no abdominal tenderness.  Musculoskeletal:        General: No swelling or deformity.     Right lower leg: No edema.  Skin:    General: Skin is warm and dry.  Neurological:     General: No focal deficit present.     Comments: Pleasantly demented Alert and oriented to name only  Psychiatric:        Mood and Affect: Mood normal.        Behavior: Behavior normal.    EKG: Personally reviewed my interpretation is sinus tachycardia. PAC.  Assessment & Plan by Problem: Active Problems:   GI bleed Assessment:  85 yo F with Hx of Mild dementia, CVA, HTN, and Hypothyroidism who presented from her facility with tachycardia and recent dark stools.  Plan: GI Bleed: Recent dark stools and tachycardia reported by facility staff. Hgb 6.1 in ED (down from baseline of 13.9 2 months prior). BUN elevated with normal Cr; which, along with dark stools, is suspicious for upper GI etiology. Patient unable assist with HPI as she is pleasantly demented and has no complaints at this time. GI consulted in the ED. 1U pRBCs ordered in ED - Appreciate GI recommendations. - 1 Unit  transfusing, follow up Hgb. - IV PPI. - NPO. - Hold Plavix.   UTI: U/A consistent with UTI. Facility documentation showed similar U/A and positive Urine Culture showing >100,000 GNRs. Unclear if she had received anabiotics at facility. Started on ceftriaxone in ED, will continue while NPO, pending culture - F/U urine culture - Continue Ceftriaxone  Hypokalemia:  - K: 2.9  -  KCl 92mEq IV x6 - Mg add-on  Dementia: Holding Buspar and Zoloft while NPO, Delirium precautions Hypothyroidism: Holding Synthroid 6mcg while NPO HTN: Holding Losartan Hx of CVA: Holding Plavix given active bleed, Hold statin while NPO  FEN: 50cc/hr, replete lytes prn, NPO VTE ppx: SCDs Code Status: DNR   Dispo: Admit patient to Inpatient with expected length of stay greater than 2 midnights.  SignedMaudie Mercury, MRI 02/22/2019, 6:38 PM  Pager: [phone]

## 2019-02-22 NOTE — ED Notes (Signed)
ED TO INPATIENT HANDOFF REPORT  ED Nurse Name and Phone #: Benjamine Mola [phone]  S Name/Age/Gender Patricia Gilbert 85 y.o. female Room/Bed: 040C/040C  Code Status   Code Status: DNR  Home/SNF/Other Nursing Home Patient oriented to: self and place Is this baseline? Yes   Triage Complete: Triage complete  Chief Complaint Altered/GI Bleed  Triage Note Pt BIB [facility] for eval of GI bleeding onset last evening. EMS reports facility noted dark and tarry stools since last night, today noted pt is more lethargic and less responsive. Facility reports dementia at baseline but states she is typically a little more alert and "feisty". Pt appears lethargic on arrival, responsive to voice.    Allergies Allergies  Allergen Reactions  . Aspirin   . Lactose Intolerance (Gi)   . Codeine Palpitations    Level of Care/Admitting Diagnosis ED Disposition    ED Disposition Condition [facility] Area: [place] [[id number]]  Level of Care: Telemetry Medical [104]  Covid Evaluation: Asymptomatic Screening Protocol (No Symptoms)  Diagnosis: GI bleed [id number]  Admitting Physician: Bosie Helper  Attending Physician: Lucious Groves [[id number]]  Estimated length of stay: past midnight tomorrow  Certification:: I certify this patient will need inpatient services for at least 2 midnights  PT Class (Do Not Modify): Inpatient [101]  PT Acc Code (Do Not Modify): Private [1]       B Medical/Surgery History Past Medical History:  Diagnosis Date  . Allergic rhinitis, cause unspecified   . Hypothyroid   . Melanoma ([place]) 1997  . Sinus tachycardia   . Syncope 08/20/2011   single event, neg echo and cards eval    Past Surgical History:  Procedure Laterality Date  . CESAREAN SECTION     [id number]  . HIP PINNING,CANNULATED Left 12/31/2017   Procedure: CANNULATED HIP PINNING;  Surgeon: Mcarthur Rossetti, MD;  Location: [facility];  Service: Orthopedics;   Laterality: Left;  Marland Kitchen MELANOMA EXCISION  1997     A IV Location/Drains/Wounds Patient Lines/Drains/Airways Status   Active Line/Drains/Airways    Name:   Placement date:   Placement time:   Site:   Days:   Peripheral IV 02/22/19 Posterior;Right Wrist   02/22/19    1354    Wrist   less than 1   Peripheral IV 02/22/19 Right Forearm   02/22/19    1417    Forearm   less than 1   External Urinary Catheter   12/31/17    0912    -   418   External Urinary Catheter   12/14/18    0901    -   70   Incision (Closed) 12/31/17 Hip Left   12/31/17    1155     418          Intake/Output Last 24 hours  Intake/Output Summary (Last 24 hours) at 02/22/2019 2023 Last data filed at 02/22/2019 1626 Gross per 24 hour  Intake 1200.66 ml  Output -  Net 1200.66 ml    Labs/Imaging Results for orders placed or performed during the hospital encounter of 02/22/19 (from the past 48 hour(s))  Sample to Blood Bank     Status: None   Collection Time: 02/22/19  2:00 PM  Result Value Ref Range   Blood Bank Specimen SAMPLE AVAILABLE FOR TESTING    Sample Expiration      02/23/2019,2359 Performed at [facility], [place] [address], [place], [place] [address]  Type and screen [place]     Status: None (Preliminary result)   Collection Time: 02/22/19  2:00 PM  Result Value Ref Range   ABO/RH(D) A NEG    Antibody Screen NEG    Sample Expiration 02/25/2019,2359    Unit Number [id number]    Blood Component Type RED CELLS,LR    Unit division 00    Status of Unit ISSUED    Transfusion Status OK TO TRANSFUSE    Crossmatch Result      Compatible Performed at [facility], [place] [address], [place], [place] [address]    Unit Number [id number]    Blood Component Type RED CELLS,LR    Unit division 00    Status of Unit ALLOCATED    Transfusion Status OK TO TRANSFUSE    Crossmatch Result Compatible    Unit Number [id number]    Blood Component Type RED CELLS,LR    Unit  division 00    Status of Unit ALLOCATED    Transfusion Status OK TO TRANSFUSE    Crossmatch Result Compatible   ABO/Rh     Status: None   Collection Time: 02/22/19  2:00 PM  Result Value Ref Range   ABO/RH(D)      A NEG Performed at [facility] [address] [place], [place] [address]   Protime-INR     Status: None   Collection Time: 02/22/19  2:04 PM  Result Value Ref Range   Prothrombin Time 14.6 11.4 - 15.2 seconds   INR 1.2 0.8 - 1.2    Comment: (NOTE) INR goal varies based on device and disease states. Performed at [facility], [place] [address] [place], [place] [address]   Comprehensive metabolic panel     Status: Abnormal   Collection Time: 02/22/19  2:04 PM  Result Value Ref Range   Sodium 145 135 - 145 mmol/L   Potassium 2.9 (L) 3.5 - 5.1 mmol/L   Chloride 110 98 - 111 mmol/L   CO2 23 22 - 32 mmol/L   Glucose, Bld 158 (H) 70 - 99 mg/dL   BUN 43 (H) 8 - 23 mg/dL   Creatinine, Ser 0.71 0.44 - 1.00 mg/dL   Calcium 8.6 (L) 8.9 - 10.3 mg/dL   Total Protein 5.1 (L) 6.5 - 8.1 g/dL   Albumin 2.6 (L) 3.5 - 5.0 g/dL   AST 30 15 - 41 U/L   ALT 13 0 - 44 U/L   Alkaline Phosphatase 45 38 - 126 U/L   Total Bilirubin 0.6 0.3 - 1.2 mg/dL   GFR calc non Af Amer >60 >60 mL/min   GFR calc Af Amer >60 >60 mL/min   Anion gap 12 5 - 15    Comment: Performed at [facility], [place] [address], [place], [place] [address]  POC occult blood, ED     Status: Abnormal   Collection Time: 02/22/19  2:11 PM  Result Value Ref Range   Fecal Occult Bld POSITIVE (A) NEGATIVE  Lactic acid, plasma     Status: Abnormal   Collection Time: 02/22/19  2:16 PM  Result Value Ref Range   Lactic Acid, Venous 3.4 (HH) 0.5 - 1.9 mmol/L    Comment: CRITICAL RESULT CALLED TO, READ BACK BY AND VERIFIED WITH: E.Jaycelynn Knickerbocker RN 1446 02/22/2019 MCCORMICK K Performed at [facility] [address] [place],  [address]   Urinalysis, Routine w reflex microscopic     Status: Abnormal  Collection Time: 02/22/19  2:20 PM  Result Value Ref Range   Color, Urine YELLOW YELLOW   APPearance CLOUDY (A) CLEAR   Specific Gravity, Urine 1.023 1.005 - 1.030   pH 5.0 5.0 - 8.0   Glucose, UA NEGATIVE NEGATIVE mg/dL   Hgb urine dipstick SMALL (A) NEGATIVE   Bilirubin Urine NEGATIVE NEGATIVE   Ketones, ur NEGATIVE NEGATIVE mg/dL   Protein, ur NEGATIVE NEGATIVE mg/dL   Nitrite NEGATIVE NEGATIVE   Leukocytes,Ua MODERATE (A) NEGATIVE   RBC / HPF 0-5 0 - 5 RBC/hpf   WBC, UA 11-20 0 - 5 WBC/hpf   Bacteria, UA MANY (A) NONE SEEN   Squamous Epithelial / LPF 6-10 0 - 5    Comment: Performed at [facility], [address], [place], [place] [address]  SARS Coronavirus 2 [facility] order, Performed in [facility] hospital lab) Nasopharyngeal Nasopharyngeal Swab     Status: None   Collection Time: 02/22/19  3:00 PM   Specimen: Nasopharyngeal Swab  Result Value Ref Range   SARS Coronavirus 2 NEGATIVE NEGATIVE    Comment: (NOTE) If result is NEGATIVE SARS-CoV-2 target nucleic acids are NOT DETECTED. The SARS-CoV-2 RNA is generally detectable in upper and lower  respiratory specimens during the acute phase of infection. The lowest  concentration of SARS-CoV-2 viral copies this assay can detect is 250  copies / mL. A negative result does not preclude SARS-CoV-2 infection  and should not be used as the sole basis for treatment or other  patient management decisions.  A negative result may occur with  improper specimen collection / handling, submission of specimen other  than nasopharyngeal swab, presence of viral mutation(s) within the  areas targeted by this assay, and inadequate number of viral copies  (<250 copies / mL). A negative result must be combined with clinical  observations, patient history, and epidemiological information. If result is POSITIVE SARS-CoV-2 target nucleic acids are DETECTED. The SARS-CoV-2 RNA is generally detectable in upper and lower  respiratory  specimens dur ing the acute phase of infection.  Positive  results are indicative of active infection with SARS-CoV-2.  Clinical  correlation with patient history and other diagnostic information is  necessary to determine patient infection status.  Positive results do  not rule out bacterial infection or co-infection with other viruses. If result is PRESUMPTIVE POSTIVE SARS-CoV-2 nucleic acids MAY BE PRESENT.   A presumptive positive result was obtained on the submitted specimen  and confirmed on repeat testing.  While 2019 novel coronavirus  (SARS-CoV-2) nucleic acids may be present in the submitted sample  additional confirmatory testing may be necessary for epidemiological  and / or clinical management purposes  to differentiate between  SARS-CoV-2 and other Sarbecovirus currently known to infect humans.  If clinically indicated additional testing with an alternate test  methodology [phone]) is advised. The SARS-CoV-2 RNA is generally  detectable in upper and lower respiratory sp ecimens during the acute  phase of infection. The expected result is Negative. Fact Sheet for Patients:  StrictlyIdeas.no Fact Sheet for Healthcare Providers: BankingDealers.co.za This test is not yet approved or cleared by the [place] FDA and has been authorized for detection and/or diagnosis of SARS-CoV-2 by FDA under an Emergency Use Authorization (EUA).  This EUA will remain in effect (meaning this test can be used) for the duration of the COVID-19 declaration under Section 564(b)(1) of the Act, 21 U.S.C. section 360bbb-3(b)(1), unless the authorization is terminated or revoked sooner. Performed at [facility], [place] [address], [place], [place] [address]   CBC with Differential     Status: Abnormal   Collection Time: 02/22/19  4:26 PM  Result Value Ref Range   WBC 12.7 (H) 4.0 - 10.5 K/uL   RBC 2.05 (L) 3.87 - 5.11 MIL/uL   Hemoglobin  6.1 (LL) 12.0 - 15.0 g/dL    Comment: This critical result has verified and been called to E Jaceyon Strole,RN by Red Christians on 08 27 2020 at 1701, and has been read back.  SPECIMEN CHECKED FOR CLOTS REPEATED TO VERIFY CORRECTED ON 08/27 AT 1940: PREVIOUSLY REPORTED AS 6.1 This critical result has verified and been called to E Reginae Wolfrey,RN by Red Christians on 08 27 2020 at 1701, and has been read back.     HCT 19.9 (L) 36.0 - 46.0 %   MCV 97.1 80.0 - 100.0 fL   MCH 29.8 26.0 - 34.0 pg   MCHC 30.7 30.0 - 36.0 g/dL   RDW 15.2 11.5 - 15.5 %   Platelets 203 150 - 400 K/uL    Comment: PLATELET CLUMPS NOTED ON SMEAR, COUNT APPEARS ADEQUATE   nRBC 0.2 0.0 - 0.2 %   Neutrophils Relative % 81 %   Neutro Abs 10.2 (H) 1.7 - 7.7 K/uL   Lymphocytes Relative 13 %   Lymphs Abs 1.7 0.7 - 4.0 K/uL   Monocytes Relative 5 %   Monocytes Absolute 0.7 0.1 - 1.0 K/uL   Eosinophils Relative 0 %   Eosinophils Absolute 0.0 0.0 - 0.5 K/uL   Basophils Relative 0 %   Basophils Absolute 0.0 0.0 - 0.1 K/uL   Immature Granulocytes 1 %   Abs Immature Granulocytes 0.17 (H) 0.00 - 0.07 K/uL    Comment: Performed at [facility], [address], [place], [place] [address]  Lactic acid, plasma     Status: None   Collection Time: 02/22/19  4:30 PM  Result Value Ref Range   Lactic Acid, Venous 1.8 0.5 - 1.9 mmol/L    Comment: Performed at [facility] [address], [place], [place] [address]  Prepare RBC     Status: None   Collection Time: 02/22/19  5:20 PM  Result Value Ref Range   Order Confirmation      ORDER PROCESSED BY BLOOD BANK Performed at [facility], [place] [address], [place], [place] [address]    No results found.  Pending Labs Unresulted Labs (From admission, onward)    Start     Ordered   02/23/19 0500  CBC  Tomorrow morning,   R     02/22/19 1837   02/23/19 [id number]  Basic metabolic panel  Tomorrow morning,   R     02/22/19 1837   02/22/19 1901  Magnesium  Add-on,   AD     02/22/19 1900    02/22/19 1837  Iron and TIBC  Add-on,   AD     02/22/19 1837   02/22/19 1837  Ferritin  Add-on,   AD     02/22/19 1837   02/22/19 1450  CBC with Differential  Once,   STAT     02/22/19 1449   02/22/19 1403  Culture, blood (routine x 2)  BLOOD CULTURE X 2,   STAT     02/22/19 1403   02/22/19 1400  Urine culture  ONCE - STAT,   STAT     02/22/19 1359  Vitals/Pain Today's Vitals   02/22/19 1930 02/22/19 1945 02/22/19 2000 02/22/19 2015  BP: (!) 155/52 (!) 156/53 (!) 148/46 (!) 155/56  Pulse: 90 91 86 87  Resp: (!) 28 17 16 20   Temp:      TempSrc:      SpO2: 94% 97% 95% 96%  Weight:      Height:        Isolation Precautions No active isolations  Medications Medications  pantoprazole (PROTONIX) 80 mg in sodium chloride 0.9 % 250 mL (0.32 mg/mL) infusion (8 mg/hr Intravenous Transfusing/Transfer 02/22/19 2022)  pantoprazole (PROTONIX) injection 40 mg (has no administration in time range)  cefTRIAXone (ROCEPHIN) 1 g in sodium chloride 0.9 % 100 mL IVPB (has no administration in time range)  zinc oxide 20 % ointment 1 application (has no administration in time range)  sodium chloride flush (NS) 0.9 % injection 3 mL (has no administration in time range)  0.9 %  sodium chloride infusion ( Intravenous Transfusing/Transfer 02/22/19 2022)  potassium chloride 10 mEq in 100 mL IVPB (10 mEq Intravenous Transfusing/Transfer 02/22/19 2020)  pantoprazole (PROTONIX) 80 mg in sodium chloride 0.9 % 100 mL IVPB (0 mg Intravenous Stopped 02/22/19 1617)  cefTRIAXone (ROCEPHIN) 1 g in sodium chloride 0.9 % 100 mL IVPB (0 g Intravenous Stopped 02/22/19 1548)  sodium chloride 0.9 % bolus 500 mL (0 mLs Intravenous Stopped 02/22/19 1626)  0.9 %  sodium chloride infusion (10 mL/hr Intravenous New Bag/Given 02/22/19 1741)    Mobility non-ambulatory High fall risk   Focused Assessments Pulmonary Assessment Handoff:  Lung sounds:   O2 Device: Room Air        R Recommendations: See  Admitting Provider Note  Report given to:   Additional Notes:

## 2019-02-22 NOTE — ED Notes (Signed)
Spoke with pt son and updated. Romeo Rabon [phone]. Son Please call if needed,

## 2019-02-22 NOTE — ED Provider Notes (Signed)
[facility] EMERGENCY DEPARTMENT Provider Note   CSN: [id number] Arrival date & time: 02/22/19  1350     History   Chief Complaint Chief Complaint  Patient presents with  . GI Bleeding    HPI Patricia Gilbert is a 85 y.o. female.     HPI Level 5 caveat due to dementia. Patient brought in for tachycardia and potential GI bleed.  Reportedly has black stool.  Patient has dementia and really cannot provide much history although she states she feels fine.  Reportedly had heart rates of 120.  Patient is a DNR.  Reviewing paperwork that came with patient appears to have a urine culture that came back positive with gram-negative rods.  Does not appear she is on antibiotics for this. Past Medical History:  Diagnosis Date  . Allergic rhinitis, cause unspecified   . Hypothyroid   . Melanoma ([place]) 1997  . Sinus tachycardia   . Syncope 08/20/2011   single event, neg echo and cards eval     Patient Active Problem List   Diagnosis Date Noted  . CVA (cerebral vascular accident) ([place]) 12/13/2018  . HTN (hypertension) 01/02/2018  . Hip fracture ([place]) 12/31/2017  . Left displaced femoral neck fracture ([place]) 12/31/2017  . Wound of left leg 03/13/2015  . Osteoporosis, senile 01/09/2013  . Allergic rhinitis, cause unspecified   . Hypothyroid   . Mild dementia ([place])   . Syncope 08/20/2011  . Sinus tachycardia 08/20/2011    Past Surgical History:  Procedure Laterality Date  . CESAREAN SECTION     [id number]  . HIP PINNING,CANNULATED Left 12/31/2017   Procedure: CANNULATED HIP PINNING;  Surgeon: Mcarthur Rossetti, MD;  Location: [facility];  Service: Orthopedics;  Laterality: Left;  Marland Kitchen MELANOMA EXCISION  1997     OB History   No obstetric history on file.      Home Medications    Prior to Admission medications   Medication Sig Start Date End Date Taking? Authorizing Provider  acetaminophen (TYLENOL) 325 MG tablet Take 1-2 tablets (325-650 mg total) by mouth  every 6 (six) hours as needed for mild pain (pain score 1-3 or temp > 100.5). 01/03/18   Shelly Coss, MD  alendronate (FOSAMAX) 70 MG tablet Take 1 tablet (70 mg total) by mouth every 7 (seven) days. Take with a full glass of water on an empty stomach. Patient taking differently: Take 70 mg by mouth every 7 (seven) days. Take with a full glass of water on an empty stomach. Thursday 01/09/13   Rowe Clack, MD  atorvastatin (LIPITOR) 20 MG tablet Take 1 tablet (20 mg total) by mouth daily at 6 PM. 12/16/18   Georgette Shell, MD  clopidogrel (PLAVIX) 75 MG tablet Take 1 tablet (75 mg total) by mouth daily. 12/17/18   Georgette Shell, MD  docusate sodium (COLACE) 100 MG capsule Take 1 capsule (100 mg total) by mouth 2 (two) times daily. 01/03/18   Shelly Coss, MD  levothyroxine (SYNTHROID, LEVOTHROID) 50 MCG tablet Take 1 tablet (50 mcg total) by mouth daily. 05/09/14   Rowe Clack, MD  losartan (COZAAR) 25 MG tablet Take 25 mg by mouth daily.    [provider]  meclizine (ANTIVERT) 12.5 MG tablet Take 12.5 mg by mouth every 4 (four) hours as needed for dizziness.    [provider]  senna (SENOKOT) 8.6 MG TABS tablet Take 1 tablet by mouth daily as needed for mild constipation.    [provider]  zinc oxide 20 % ointment Apply 1 application topically as needed for irritation (groin).    [provider]    Family History Family History  Problem Relation Age of Onset  . Prostate cancer Father   . Diabetes Other   . Stroke Other     Social History Social History   Tobacco Use  . Smoking status: Never Smoker  . Smokeless tobacco: Never Used  Substance Use Topics  . Alcohol use: Yes  . Drug use: No     Allergies   Aspirin, Lactose intolerance (gi), and Codeine   Review of Systems Review of Systems  Unable to perform ROS: Dementia     Physical Exam Updated Vital Signs BP (!) 147/54 (BP Location: Right Arm)   Pulse  (!) 101   Temp 99 F (37.2 C) (Rectal)   Resp 18   Ht 5\' 1"  (1.549 m)   Wt 45 kg   SpO2 94%   BMI 18.74 kg/m   Physical Exam Vitals signs and nursing note reviewed.  HENT:     Head: Normocephalic.  Eyes:     Pupils: Pupils are equal, round, and reactive to light.  Cardiovascular:     Rate and Rhythm: Tachycardia present.     Comments: Tachycardia Pulmonary:     Effort: Pulmonary effort is normal.  Abdominal:     Tenderness: There is no abdominal tenderness.  Musculoskeletal:     Right lower leg: No edema.     Left lower leg: No edema.  Skin:    Capillary Refill: Capillary refill takes less than 2 seconds.     Coloration: Skin is pale.  Neurological:     Mental Status: She is alert.     Comments: Patient is at what appears to be her baseline dementia.      ED Treatments / Results  Labs (all labs ordered are listed, but only abnormal results are displayed) Labs Reviewed  URINALYSIS, ROUTINE W REFLEX MICROSCOPIC - Abnormal; Notable for the following components:      Result Value   APPearance CLOUDY (*)    Hgb urine dipstick SMALL (*)    Leukocytes,Ua MODERATE (*)    Bacteria, UA MANY (*)    All other components within normal limits  COMPREHENSIVE METABOLIC PANEL - Abnormal; Notable for the following components:   Potassium 2.9 (*)    Glucose, Bld 158 (*)    BUN 43 (*)    Calcium 8.6 (*)    Total Protein 5.1 (*)    Albumin 2.6 (*)    All other components within normal limits  LACTIC ACID, PLASMA - Abnormal; Notable for the following components:   Lactic Acid, Venous 3.4 (*)    All other components within normal limits  POC OCCULT BLOOD, ED - Abnormal; Notable for the following components:   Fecal Occult Bld POSITIVE (*)    All other components within normal limits  URINE CULTURE  CULTURE, BLOOD (ROUTINE X 2)  CULTURE, BLOOD (ROUTINE X 2)  SARS CORONAVIRUS 2 (HOSPITAL ORDER, [facility])  PROTIME-INR  LACTIC ACID, PLASMA  CBC  WITH DIFFERENTIAL/PLATELET  SAMPLE TO BLOOD BANK    EKG EKG Interpretation  Date/Time:  Thursday February 22 2019 13:52:15 EDT Ventricular Rate:  106 PR Interval:    QRS Duration: 81 QT Interval:  337 QTC Calculation: 448 R Axis:   -13 Text Interpretation:  Sinus tachycardia Atrial premature complex Consider left ventricular hypertrophy Confirmed by Davonna Belling (  [id number]) on 02/22/2019 2:02:37 PM   Radiology No results found.  Procedures Procedures (including critical care time)  Medications Ordered in ED Medications  pantoprazole (PROTONIX) 80 mg in sodium chloride 0.9 % 100 mL IVPB (has no administration in time range)  pantoprazole (PROTONIX) 80 mg in sodium chloride 0.9 % 250 mL (0.32 mg/mL) infusion (has no administration in time range)  pantoprazole (PROTONIX) injection 40 mg (has no administration in time range)  cefTRIAXone (ROCEPHIN) 1 g in sodium chloride 0.9 % 100 mL IVPB (has no administration in time range)  sodium chloride 0.9 % bolus 500 mL (has no administration in time range)     Initial Impression / Assessment and Plan / ED Course  I have reviewed the triage vital signs and the nursing notes.  Pertinent labs & imaging results that were available during my care of the patient were reviewed by me and considered in my medical decision making (see chart for details).        Patient sent in for GI bleed.  On Plavix.  Also tachycardia.  Blood pressure maintained.  Has UTI and not been on antibiotics.  Fluid bolus given.  Lactic acid 3.4.  I think this is less likely severe sepsis and the GI bleed could be a component of the tachycardia.  Will not give full fluid bolus at this time to further delineated the potential hypovolemia.  Protonix drip started.  Will require admission after CBC returns. Care turned over to oncoming provider.  CRITICAL CARE Performed by: Davonna Belling Total critical care time: 30 minutes Critical care time was exclusive of  separately billable procedures and treating other patients. Critical care was necessary to treat or prevent imminent or life-threatening deterioration. Critical care was time spent personally by me on the following activities: development of treatment plan with patient and/or surrogate as well as nursing, discussions with consultants, evaluation of patient's response to treatment, examination of patient, obtaining history from patient or surrogate, ordering and performing treatments and interventions, ordering and review of laboratory studies, ordering and review of radiographic studies, pulse oximetry and re-evaluation of patient's condition.   Final Clinical Impressions(s) / ED Diagnoses   Final diagnoses:  Gastrointestinal hemorrhage, unspecified gastrointestinal hemorrhage type  Urinary tract infection without hematuria, site unspecified    ED Discharge Orders    None       Davonna Belling, MD 02/22/19 1513

## 2019-02-22 NOTE — ED Provider Notes (Signed)
I received this patient in signout from Dr. Alvino Chapel. We were awaiting CBC prior to admission for GI bleed and UTI. Hgb 6.1, compared to 12-13 previously. Ordered 1 u PRBC. Son in [place] gave nurse verbal consent over the phone. Discussed w/ GI, Dr. Loletha Carrow; they will see pt in consultation. Pt admitted to internal med teaching service for further care. VS stable at time of admission.  CRITICAL CARE Performed by: Wenda Overland Little   Total critical care time: 30 minutes  Critical care time was exclusive of separately billable procedures and treating other patients.  Critical care was necessary to treat or prevent imminent or life-threatening deterioration.  Critical care was time spent personally by me on the following activities: development of treatment plan with patient and/or surrogate as well as nursing, discussions with consultants, evaluation of patient's response to treatment, examination of patient, obtaining history from patient or surrogate, ordering and performing treatments and interventions, ordering and review of laboratory studies, ordering and review of radiographic studies, pulse oximetry and re-evaluation of patient's condition.    Little, Wenda Overland, MD 02/22/19 [phone]

## 2019-02-22 NOTE — ED Notes (Signed)
Help the nurse with in and out patient is now resting with call bell in reach

## 2019-02-23 DIAGNOSIS — Z9889 Other specified postprocedural states: Secondary | ICD-10-CM

## 2019-02-23 DIAGNOSIS — E861 Hypovolemia: Secondary | ICD-10-CM

## 2019-02-23 DIAGNOSIS — Z7902 Long term (current) use of antithrombotics/antiplatelets: Secondary | ICD-10-CM

## 2019-02-23 DIAGNOSIS — N390 Urinary tract infection, site not specified: Secondary | ICD-10-CM

## 2019-02-23 DIAGNOSIS — D649 Anemia, unspecified: Secondary | ICD-10-CM | POA: Diagnosis present

## 2019-02-23 DIAGNOSIS — D62 Acute posthemorrhagic anemia: Principal | ICD-10-CM

## 2019-02-23 DIAGNOSIS — K922 Gastrointestinal hemorrhage, unspecified: Secondary | ICD-10-CM

## 2019-02-23 LAB — CBC
HCT: 24.2 % — ABNORMAL LOW (ref 36.0–46.0)
HCT: 23.3 % — ABNORMAL LOW (ref 36.0–46.0)
Hemoglobin: 7.6 g/dL — ABNORMAL LOW (ref 12.0–15.0)
Hemoglobin: 7.4 g/dL — ABNORMAL LOW (ref 12.0–15.0)
MCH: 29.8 pg (ref 26.0–34.0)
MCH: 30 pg (ref 26.0–34.0)
MCHC: 31.4 g/dL (ref 30.0–36.0)
MCHC: 31.8 g/dL (ref 30.0–36.0)
MCV: 94.9 fL (ref 80.0–100.0)
MCV: 94.3 fL (ref 80.0–100.0)
Platelets: 193 10*3/uL (ref 150–400)
Platelets: 182 10*3/uL (ref 150–400)
RBC: 2.55 MIL/uL — ABNORMAL LOW (ref 3.87–5.11)
RBC: 2.47 MIL/uL — ABNORMAL LOW (ref 3.87–5.11)
RDW: 17.2 % — ABNORMAL HIGH (ref 11.5–15.5)
RDW: 17.2 % — ABNORMAL HIGH (ref 11.5–15.5)
WBC: 14.2 10*3/uL — ABNORMAL HIGH (ref 4.0–10.5)
WBC: 9.9 10*3/uL (ref 4.0–10.5)
nRBC: 0.4 % — ABNORMAL HIGH (ref 0.0–0.2)
nRBC: 0.5 % — ABNORMAL HIGH (ref 0.0–0.2)

## 2019-02-23 LAB — BLOOD CULTURE ID PANEL (REFLEXED)

## 2019-02-23 LAB — FERRITIN: Ferritin: 174 ng/mL (ref 11–307)

## 2019-02-23 LAB — BASIC METABOLIC PANEL
Anion gap: 7 (ref 5–15)
BUN: 30 mg/dL — ABNORMAL HIGH (ref 8–23)
CO2: 24 mmol/L (ref 22–32)
Calcium: 7.8 mg/dL — ABNORMAL LOW (ref 8.9–10.3)
Chloride: 117 mmol/L — ABNORMAL HIGH (ref 98–111)
Creatinine, Ser: 0.57 mg/dL (ref 0.44–1.00)
GFR calc Af Amer: >60 mL/min (ref >60)
GFR calc non Af Amer: >60 mL/min (ref >60)
Glucose, Bld: 91 mg/dL (ref 70–99)
Potassium: 4.3 mmol/L (ref 3.5–5.1)
Sodium: 148 mmol/L — ABNORMAL HIGH (ref 135–145)

## 2019-02-23 LAB — IRON AND TIBC
Iron: 108 ug/dL (ref 28–170)
Saturation Ratios: 55 % — ABNORMAL HIGH (ref 10.4–31.8)
TIBC: 195 ug/dL — ABNORMAL LOW (ref 250–450)
UIBC: 87 ug/dL

## 2019-02-23 LAB — MAGNESIUM: Magnesium: 1.9 mg/dL (ref 1.7–2.4)

## 2019-02-23 MED ORDER — DEXTROSE 5 % IV SOLN
INTRAVENOUS | Status: AC
  Administered 2019-02-23: 16:00:00 via INTRAVENOUS

## 2019-02-23 MED ORDER — PANTOPRAZOLE SODIUM 40 MG IV SOLR
40.0000 mg | Freq: Two times a day (BID) | INTRAVENOUS | Status: DC
  Administered 2019-02-24 – 2019-02-25 (×4): 40 mg via INTRAVENOUS
  Filled 2019-02-23 (×4): qty 40

## 2019-02-23 NOTE — Consult Note (Addendum)
[facility] Consult: 9:04 AM 02/23/2019  LOS: 1 day    Referring Provider: Dr Heber [place]  Primary Care Physician:  Patient, No Pcp Per Primary Gastroenterologist:   Gurtha Lindwall, Claudius Sis   [phone]  [place], Brigance   [phone]      Reason for Consultation:  Melena, anemia   HPI: Patricia Gilbert is a 85 y.o. female.  SNF resident.  Hx Dementia.  Hypothyroidism.  Facial melanoma 2015.  CVA 11/2018, on Plavix since.  12/2017 repair left femoral neck fracture. No PPI or H2 blocker on ooutpt med list. No records of any GI procedures  Developed dark stools for ~24 hours at SNF, tachycardia, increased lethargy, less responsive than baseline.  Recent GNR UTI. Hgb 6.1 >> 1 U PRBCs >> 7.6.  Was 13.5  -13.9 in mid June. MCV 94. Elevated WBCs 14.2.  Platelets normal. INR 1.2 Bun 43 >> 30. Not iron deficient. Urinalysis consistent with UTI. Started on Protonix drip, Rocephin, n.p.o.  Per notes pt denied abd pain, nausea.  Currently answering "no" or "I dont know" to every question asked. Staff say no stools or emesis or c/o abd pain since arrival.    Weight of 47 kg 2013.  Current weight 45 kg    Past Medical History:  Diagnosis Date  . Allergic rhinitis, cause unspecified   . Hypothyroid   . Melanoma ([place]) 1997  . Sinus tachycardia   . Syncope 08/20/2011   single event, neg echo and cards eval     Past Surgical History:  Procedure Laterality Date  . CESAREAN SECTION     [id number]  . HIP PINNING,CANNULATED Left 12/31/2017   Procedure: CANNULATED HIP PINNING;  Surgeon: Mcarthur Rossetti, MD;  Location: [facility];  Service: Orthopedics;  Laterality: Left;  Marland Kitchen MELANOMA EXCISION  1997    Prior to Admission medications   Medication Sig Start Date End Date Taking? Authorizing Provider   acetaminophen (TYLENOL) 325 MG tablet Take 1-2 tablets (325-650 mg total) by mouth every 6 (six) hours as needed for mild pain (pain score 1-3 or temp > 100.5). 01/03/18  Yes Shelly Coss, MD  alendronate (FOSAMAX) 70 MG tablet Take 1 tablet (70 mg total) by mouth every 7 (seven) days. Take with a full glass of water on an empty stomach. Patient taking differently: Take 70 mg by mouth every 7 (seven) days. Take with a full glass of water on an empty stomach. Thursday 01/09/13  Yes Rowe Clack, MD  atorvastatin (LIPITOR) 20 MG tablet Take 1 tablet (20 mg total) by mouth daily at 6 PM. Patient taking differently: Take 20 mg by mouth daily.  12/16/18  Yes Georgette Shell, MD  busPIRone (BUSPAR) 7.5 MG tablet Take 7.5 mg by mouth 2 (two) times daily.   Yes [provider]  clopidogrel (PLAVIX) 75 MG tablet Take 1 tablet (75 mg total) by mouth daily. 12/17/18  Yes Georgette Shell, MD  levothyroxine (SYNTHROID, LEVOTHROID) 50 MCG tablet Take 1 tablet (50 mcg total) by mouth daily. 05/09/14  Yes Rowe Clack,  MD  losartan (COZAAR) 25 MG tablet Take 25 mg by mouth daily.   Yes [provider]  miconazole (MONISTAT 7) 2 % vaginal cream Place 1 Applicatorful vaginally at bedtime.   Yes [provider]  senna (SENOKOT) 8.6 MG TABS tablet Take 2 tablets by mouth daily.    Yes [provider]  sertraline (ZOLOFT) 25 MG tablet Take 25 mg by mouth daily.   Yes [provider]  zinc oxide 20 % ointment Apply 1 application topically See admin instructions. Apply to Groin every shift   Yes [provider]  zinc sulfate 220 (50 Zn) MG capsule Take 220 mg by mouth daily.   Yes [provider]  docusate sodium (COLACE) 100 MG capsule Take 1 capsule (100 mg total) by mouth 2 (two) times daily. Patient not taking: Reported on 02/22/2019 01/03/18   Shelly Coss, MD    Scheduled Meds: . [START ON 02/26/2019] pantoprazole  40 mg  Intravenous Q12H  . sodium chloride flush  3 mL Intravenous Q12H  . zinc oxide  1 application Topical BID   Infusions: . cefTRIAXone (ROCEPHIN)  IV    . pantoprozole (PROTONIX) infusion 8 mg/hr (02/22/19 1627)   PRN Meds:    Allergies as of 02/22/2019 - Review Complete 02/22/2019  Allergen Reaction Noted  . Aspirin  08/20/2011  . Lactose intolerance (gi)  12/20/2018  . Codeine Palpitations 06/18/2014    Family History  Problem Relation Age of Onset  . Prostate cancer Father   . Diabetes Other   . Stroke Other     Social History   Socioeconomic History  . Marital status: Married    Spouse name: Not on file  . Number of children: Not on file  . Years of education: Not on file  . Highest education level: Not on file  Occupational History  . Not on file  Social Needs  . Financial resource strain: Not on file  . Food insecurity    Worry: Not on file    Inability: Not on file  . Transportation needs    Medical: Not on file    Non-medical: Not on file  Tobacco Use  . Smoking status: Never Smoker  . Smokeless tobacco: Never Used  Substance and Sexual Activity  . Alcohol use: Yes  . Drug use: No  . Sexual activity: Not on file  Lifestyle  . Physical activity    Days per week: Not on file    Minutes per session: Not on file  . Stress: Not on file  Relationships  . Social Herbalist on phone: Not on file    Gets together: Not on file    Attends religious service: Not on file    Active member of club or organization: Not on file    Attends meetings of clubs or organizations: Not on file    Relationship status: Not on file  . Intimate partner violence    Fear of current or ex partner: Not on file    Emotionally abused: Not on file    Physically abused: Not on file    Forced sexual activity: Not on file  Other Topics Concern  . Not on file  Social History Narrative  . Not on file    REVIEW OF SYSTEMS: Constitutional: Ambulatory status unknown.  ENT:  No nose bleeds Pulm: No mention of respiratory issues. CV:  No palpitations, no LE edema.  GU:  No hematuria, no frequency GI: Mention of  problems swallowing.  Speech-language evaluation of 12/15/2018 with recommendation D1 (pured), thick liquids for safety though no overt aspiration on BS swallow.   Heme: Reports unusual bleeding or large hematomas Transfusions: Received blood overnight Neuro:  No headaches, no peripheral tingling or numbness Derm:  No itching, no rash or sores.  Endocrine:  No sweats or chills.  No polyuria or dysuria Immunization: Reviewed. Travel:  None for years.  Living in skilled nursing facility and/or group home for a few years.   PHYSICAL EXAM: Vital signs in last 24 hours: Vitals:   02/22/19 2031 02/22/19 2151  BP:  (!) 166/53  Pulse:  88  Resp: 17 16  Temp:  98.4 F (36.9 C)  SpO2:  99%   Wt Readings from Last 3 Encounters:  02/22/19 45 kg  12/20/18 44.9 kg  12/13/18 45.2 kg   Note: early on in the physical exam pt said "to get the hell out of here".  I did not complete a full physical exam. General: Cachectic, aged, frail woman curled up in fetal position on the bed. Head: No signs of head trauma.  No facial asymmetry was. Eyes: No icterus. Ears: Not hard of hearing Nose: Discharge Mouth: Blood at the mouth. Neck:  Not done Lungs: No labored breathing or cough. Heart: RRR.  NSR in 70s in on tele Abdomen:  ND.   Rectal:   Not done Musc/Skeltl: osteoporotic looking.   Extremities:  Muscle wasting, laying curled up in fetal position on bed  Neurologic:  "No" or "I dont know" answers to all questions.   Skin:  Multiple pressure dressings on arms with purpura Nodes:     Psych:  [place].    Intake/Output from previous day: 08/27 0701 - 08/28 0700 In: 2589 [I.V.:1319.4; Blood:469; IV Piggyback:800.7] Out: -  Intake/Output this shift: No intake/output data recorded.  LAB RESULTS: Recent Labs    02/22/19 1626 02/23/19 0601  WBC  12.7* 14.2*  HGB 6.1* 7.6*  HCT 19.9* 24.2*  PLT 203 193   BMET Lab Results  Component Value Date   NA 148 (H) 02/23/2019   NA 145 02/22/2019   NA 134 (L) 12/13/2018   K 4.3 02/23/2019   K 2.9 (L) 02/22/2019   K 3.9 12/13/2018   CL 117 (H) 02/23/2019   CL 110 02/22/2019   CL 101 12/13/2018   CO2 24 02/23/2019   CO2 23 02/22/2019   CO2 25 12/13/2018   GLUCOSE 91 02/23/2019   GLUCOSE 158 (H) 02/22/2019   GLUCOSE 111 (H) 12/13/2018   BUN 30 (H) 02/23/2019   BUN 43 (H) 02/22/2019   BUN 16 12/13/2018   CREATININE 0.57 02/23/2019   CREATININE 0.71 02/22/2019   CREATININE 0.60 12/13/2018   CALCIUM 7.8 (L) 02/23/2019   CALCIUM 8.6 (L) 02/22/2019   CALCIUM 9.2 12/13/2018   LFT Recent Labs    02/22/19 1404  PROT 5.1*  ALBUMIN 2.6*  AST 30  ALT 13  ALKPHOS 45  BILITOT 0.6   PT/INR Lab Results  Component Value Date   INR 1.2 02/22/2019   INR 1.1 12/13/2018    Drugs of Abuse     Component Value Date/Time   LABOPIA NONE DETECTED 12/14/2018 0152   COCAINSCRNUR NONE DETECTED 12/14/2018 0152   LABBENZ NONE DETECTED 12/14/2018 0152   AMPHETMU NONE DETECTED 12/14/2018 0152   THCU NONE DETECTED 12/14/2018 0152   LABBARB NONE DETECTED 12/14/2018 0152     RADIOLOGY STUDIES: No results found.   IMPRESSION:   *  Upper GI bleed in setting of Plavix. Patient takes Fosamax every Thursday so could possibly have ulcer and associated bleeding from this medication.  Rule out PUD, AVMs, neoplasia.    *     CVA 11/2018 on Plavix since.  Plavix currently on hold.  Latest dose 8/27.  *    Dementia.     PLAN:     *   EGD after Plavix washout.  If we wait full 5 days, looking at 9/1.     *   Switch to BID IV Protonix.  Allow clear diet.  Daily CBC.   *   Hold Fosamax and consider stopping altogether given age and her current fetal body positioning.  If she is bed bound then def should not be getting this as needs upright for 2 to 3 hours after swallowing this med.     Azucena Freed  02/23/2019, 9:04 AM Phone [phone]     Attending Physician Note   I have taken a history, examined the patient and reviewed the chart. I agree with the Advanced Practitioner's note, impression and recommendations. Pt was not cooperative with full interview and only allowed a partial physical exam.    UGI bleeding on Plavix. Possible Fosamax induced esophagitis. R/O ulcer, AVM, gastritis, etc.  If patient and family want to pursue EGD will plan for a 5 day Plavix washout before proceeding. Hold Plavix. Hold Fosamax and recommend discontinuing completely given risks/benefits. IV PPI bid. Trend CBC. Clear liquids for now. If bleeding resolves its reasonable to leave off Fosamax and treat with PPI long term for possible esophagitis, ulcer and avoid EGD.   Lucio Edward, MD [facility]

## 2019-02-23 NOTE — Progress Notes (Signed)
PHARMACY - PHYSICIAN COMMUNICATION CRITICAL VALUE ALERT - BLOOD CULTURE IDENTIFICATION (BCID)  Patricia Gilbert is an 85 y.o. female who presented to [facility] on 02/22/2019 with a chief complaint of tachycardia and dark stools  Assessment:  1/4 bottles coag negative staph MecA detected, likely contaminant will not treat  Name of physician (or Provider) Contacted: Laverle Hobby, MD  Current antibiotics: Ceftriaxone   Changes to prescribed antibiotics recommended:  Recommendations accepted by provider  Continue current therapy, treating UTI for three days with CTX  Results for orders placed or performed during the hospital encounter of 02/22/19  Blood Culture ID Panel (Reflexed) (Collected: 02/22/2019  3:50 PM)  Result Value Ref Range   Enterococcus species NOT DETECTED NOT DETECTED   Listeria monocytogenes NOT DETECTED NOT DETECTED   Staphylococcus species DETECTED (A) NOT DETECTED   Staphylococcus aureus (BCID) NOT DETECTED NOT DETECTED   Methicillin resistance DETECTED (A) NOT DETECTED   Streptococcus species NOT DETECTED NOT DETECTED   Streptococcus agalactiae NOT DETECTED NOT DETECTED   Streptococcus pneumoniae NOT DETECTED NOT DETECTED   Streptococcus pyogenes NOT DETECTED NOT DETECTED   Acinetobacter baumannii NOT DETECTED NOT DETECTED   Enterobacteriaceae species NOT DETECTED NOT DETECTED   Enterobacter cloacae complex NOT DETECTED NOT DETECTED   Escherichia coli NOT DETECTED NOT DETECTED   Klebsiella oxytoca NOT DETECTED NOT DETECTED   Klebsiella pneumoniae NOT DETECTED NOT DETECTED   Proteus species NOT DETECTED NOT DETECTED   Serratia marcescens NOT DETECTED NOT DETECTED   Haemophilus influenzae NOT DETECTED NOT DETECTED   Neisseria meningitidis NOT DETECTED NOT DETECTED   Pseudomonas aeruginosa NOT DETECTED NOT DETECTED   Candida albicans NOT DETECTED NOT DETECTED   Candida glabrata NOT DETECTED NOT DETECTED   Candida krusei NOT DETECTED NOT DETECTED   Candida  parapsilosis NOT DETECTED NOT DETECTED   Candida tropicalis NOT DETECTED NOT DETECTED    Phillis Haggis 02/23/2019  12:08 PM

## 2019-02-23 NOTE — Progress Notes (Signed)
MICHAE PLYMIRE ZY:[id number] Admission Data: 02/22/2019 9:00pm Attending Provider: Lucious Groves, DO  BP:[id number], No Pcp Per Consults/ Treatment Team: Treatment Team:  Ladene Artist, MD  DEAUN BELLEW is a 85 y.o. female patient admitted from ED awake, alert  & orientated to self,  DNR, VSS - Blood pressure (!) 166/53, pulse 88, temperature 98.4 F (36.9 C), temperature source Oral, resp. rate 16, height 5\' 1"  (1.549 m), weight 45 kg, SpO2 99 %., O2 Room Air, no c/o shortness of breath, no c/o chest pain, no distress noted. Tele # 19 placed and pt is currently running: NSR.   IV site WDL: Right wrist currently infusing blood and Right Forearm currently infusing Protonix drip and NS. Both have transparent dsg that's clean dry and intact.  Allergies:   Allergies  Allergen Reactions  . Aspirin   . Lactose Intolerance (Gi)   . Codeine Palpitations     Past Medical History:  Diagnosis Date  . Allergic rhinitis, cause unspecified   . Hypothyroid   . Melanoma ([place]) 1997  . Sinus tachycardia   . Syncope 08/20/2011   single event, neg echo and cards eval     History: Mild Dementia, CVA, HTN, Hypothyroidism, Osteoporosis, Left hip fx s/p pinning (12/2017)  Pt orientation to unit, room and routine. Information packet given to patient and safety video watched.  Admission INP armband ID verified with patient/family, and in place. SR up x 2, fall risk assessment complete with Patient and placed as high risk. Pt unable to verbalized understanding. Call bell with in reach and bed alarm on. Skin has evidence of bruising on all extremities. Skin tears noted on left arm x 5. Have placed foam bandages. Evidence of MASD and placed zinc oxide. Non blanching area noted to lower back dry- intact without evidence of bruising, or skin tears.   Will cont to monitor and assist as needed.  Eliezer Champagne, RN 02/23/2019 2:20 AM

## 2019-02-23 NOTE — Progress Notes (Signed)
Subjective: Pt seen at the bedside on rounds this AM. Patricia Gilbert states she feels great this morning. She denies any pain or discomfort. She is very confused. Not oriented to place or date. Responds "I don't know" to all orientation questions. The team reoriented her, and we discussed cause of admission to the hospital was for a GI bleed and we have given her fluids and pRBCs.   Objective:  Vital signs in last 24 hours: Vitals:   02/22/19 2015 02/22/19 2030 02/22/19 2031 02/22/19 2151  BP: (!) 155/56 (!) 137/56  (!) 166/53  Pulse: 87 90  88  Resp: 20  17 16   Temp:    98.4 F (36.9 C)  TempSrc:    Oral  SpO2: 96% 95%  99%  Weight:      Height:       Physical Exam Vitals signs reviewed.  Constitutional:      Comments: Frail appearing  HENT:     Head: Normocephalic and atraumatic.     Mouth/Throat:     Mouth: Mucous membranes are dry.  Eyes:     General: No scleral icterus.       Right eye: No discharge.        Left eye: No discharge.     Extraocular Movements: Extraocular movements intact.  Cardiovascular:     Rate and Rhythm: Normal rate and regular rhythm.     Pulses: Normal pulses.     Heart sounds: Normal heart sounds. No murmur. No friction rub. No gallop.   Pulmonary:     Effort: Pulmonary effort is normal. No respiratory distress.     Breath sounds: Normal breath sounds. No wheezing or rales.  Abdominal:     General: Abdomen is flat. Bowel sounds are normal. There is no distension.     Palpations: Abdomen is soft.     Tenderness: There is no abdominal tenderness. There is no guarding.  Skin:    Findings: Bruising (at sites of IV lines and bilateral shins) present.     Comments: Dry cracked skin on bilateral upper and lower extremities  Neurological:     Mental Status: She is alert. She is disoriented.     Cranial Nerves: Cranial nerve deficit: unable to fully access due to inability to follow commands.     Comments: Intermittently follows commands   Psychiatric:        Mood and Affect: Mood normal.     Comments: Pleasantly confused    Assessment/Plan:  Active Problems:   GI bleed  [name] is a 85 yo F with hx of dementia, CVA, HTN, and Hypothyroidism who presented from her living facility with tachycardia, recent dark stools suggestive of GI bleed.  GI Bleed: Recent dark stools and tachycardia reported by facility staff. Hgb 6.1 in ED (down from baseline of 13.9 2 months prior). BUN elevated with normal Cr; which, along with dark stools, is suspicious for upper GI etiology. GI has been consulted. The patient is s/p 1U pRBC (Hgb  7.6). - Appreciate GI recommendations  - Will not consider endoscopy until the patient has discontinued Plavix for 5 days.   - Discussed plan with Romeo Rabon, son of [name], and discussed the risks and benefits of an endoscopy. It was decided that he wishes for her not to undergo the procedure unless her bleed worsens. - Check CBC this evening and tomorrow AM - IV PPI. - NPO. - d/c Plavix.   UTI: U/A consistent with UTI. Facility documentation  showed similar U/A and positive Urine Culture showing >100,000 GNRs. Unclear if she had received anabiotics at facility. Pt is currently afebrile  - urine & blood cultures pending  - Continue Ceftriaxone (day 2)   Hypovolemic Hypernatremia: Presumed hypovolemic hypernatremia given dry status on physical exam and Na 148 today. Free water deficit calculated to be 1.2 L  - Ordered D5W 1L over 10hrs today  - daily BMP  Hypokalemia: Resolved. 2.9 on admission, 4.3 today - daily BMP  HTN - holding home losartan  Hx of CVA:  - Holding home plavix given active bleed  Hypothyroidism - holding home synthroid 50 mcg   Dementia:  -Holding Buspar and Zoloft  - Delterium precautions   FEN: 50cc/hr, replete lytes prn, NPO VTE ppx: SCDs Code Status: DNR    Dispo: Anticipated discharge pending medical improvement.

## 2019-02-23 NOTE — Progress Notes (Addendum)
Discussed with Mr. Patricia Gilbert who is Ms.Hehir's son regarding her hospital course. Discussed that after transfusion her hemoglobin has improved and her vitals are currently stable. Discussed plan to trend her blood count and discontinuing Fosamax and Plavix. Discussed benefits and risks of performing invasive endoscopy to determine site of bleed, including risk of perforation and early demise especially with her advanced age and frailty. Mr.Sheckler expressed understanding and states he would wish for her to not undergo the procedure unless her bleed worsens. All other questions and concerns addressed.

## 2019-02-23 NOTE — Progress Notes (Signed)
Patient had a 5beat run of Vtach per CCMD. VSS obtained and provider notified.

## 2019-02-24 DIAGNOSIS — K921 Melena: Secondary | ICD-10-CM

## 2019-02-24 DIAGNOSIS — D649 Anemia, unspecified: Secondary | ICD-10-CM

## 2019-02-24 LAB — CBC
HCT: 22.9 % — ABNORMAL LOW (ref 36.0–46.0)
HCT: 24.5 % — ABNORMAL LOW (ref 36.0–46.0)
Hemoglobin: 7.2 g/dL — ABNORMAL LOW (ref 12.0–15.0)
Hemoglobin: 7.9 g/dL — ABNORMAL LOW (ref 12.0–15.0)
MCH: 30 pg (ref 26.0–34.0)
MCH: 30.6 pg (ref 26.0–34.0)
MCHC: 31.4 g/dL (ref 30.0–36.0)
MCHC: 32.2 g/dL (ref 30.0–36.0)
MCV: 95.4 fL (ref 80.0–100.0)
MCV: 95 fL (ref 80.0–100.0)
Platelets: 177 10*3/uL (ref 150–400)
Platelets: 177 10*3/uL (ref 150–400)
RBC: 2.4 MIL/uL — ABNORMAL LOW (ref 3.87–5.11)
RBC: 2.58 MIL/uL — ABNORMAL LOW (ref 3.87–5.11)
RDW: 17 % — ABNORMAL HIGH (ref 11.5–15.5)
RDW: 16.4 % — ABNORMAL HIGH (ref 11.5–15.5)
WBC: 9.5 10*3/uL (ref 4.0–10.5)
WBC: 10.1 10*3/uL (ref 4.0–10.5)
nRBC: 0.5 % — ABNORMAL HIGH (ref 0.0–0.2)
nRBC: 0.4 % — ABNORMAL HIGH (ref 0.0–0.2)

## 2019-02-24 LAB — URINE CULTURE: Culture: 70000 — AB

## 2019-02-24 MED ORDER — DEXTROSE 5 % IV SOLN
INTRAVENOUS | Status: AC
  Administered 2019-02-24 (×3): via INTRAVENOUS

## 2019-02-24 NOTE — Progress Notes (Signed)
    Progress Note   Subjective  No recurrent bleeding reported.    Objective  Vital signs in last 24 hours: Temp:  [97.9 F (36.6 C)-98.5 F (36.9 C)] 97.9 F (36.6 C) (08/29 0519) Pulse Rate:  [78-80] 78 (08/29 0519) Resp:  [15-16] 16 (08/29 0519) BP: (146-158)/(56-60) 157/56 (08/29 0519) SpO2:  [98 %-99 %] 99 % (08/29 0519) Weight:  [46.5 kg] 46.5 kg (08/29 0510) Last BM Date: (pta)  General: Elderly, well-developed, in NAD Heart:  Regular rate and rhythm; no murmurs Chest: Clear to ascultation bilaterally Abdomen:  Soft, nontender and nondistended. Normal bowel sounds, without guarding, and without rebound.   Extremities:  Without edema.   Intake/Output from previous day: 08/28 0701 - 08/29 0700 In: 640.4 [I.V.:540.4; IV Piggyback:100] Out: 220 [Urine:220] Intake/Output this shift: Total I/O In: 240 [P.O.:240] Out: 500 [Urine:500]  Lab Results: Recent Labs    02/23/19 0601 02/23/19 2015 02/24/19 0317  WBC 14.2* 9.9 9.5  HGB 7.6* 7.4* 7.2*  HCT 24.2* 23.3* 22.9*  PLT 193 182 177   BMET Recent Labs    02/22/19 1404 02/23/19 0601  NA 145 148*  K 2.9* 4.3  CL 110 117*  CO2 23 24  GLUCOSE 158* 91  BUN 43* 30*  CREATININE 0.71 0.57  CALCIUM 8.6* 7.8*   LFT Recent Labs    02/22/19 1404  PROT 5.1*  ALBUMIN 2.6*  AST 30  ALT 13  ALKPHOS 45  BILITOT 0.6   PT/INR Recent Labs    02/22/19 1404  LABPROT 14.6  INR 1.2      Assessment & Recommendations   1. UGI bleed with melena, resolving. Suspected esophagitis. Possible gastritis, ulcer, AVM. Family does not want to pursue EGD unless persistent bleeding. Recommend PPI bid, stop Fosamax and continue to hold Plavix. Wait at least 5 days after bleeding has stopped to consider resuming Plavix as indicated. Advance diet post speech path evaluation.   2. Anemia, Hb improved post transfusion. Trend CBC.   GI signing off, available for questions, problems.    LOS: 2 days   Norberto Sorenson T. Fuller Plan MD  02/24/2019, 1:59 PM

## 2019-02-24 NOTE — Progress Notes (Signed)
Patient offered sips of water multiple times during this shift with each visit to her room including Q2h rounding. Patient drank approximately 8 ounces of fluid this shift.

## 2019-02-24 NOTE — Progress Notes (Signed)
Attempted to contact the patient's son, Patricia Gilbert. Did not answer and no voicemail was left. Will try to contact again later today.   Ina Homes, MD  [facility] PGY3  Pager: [phone]

## 2019-02-24 NOTE — Progress Notes (Signed)
   Subjective: Patient states she is doing well today but is unclear why she is in the hospital. We discussed why she is in the hospital and that monitoring her blood counts. She tells me she lives with her husband. We discussed the plan to monitor her for another 1-2 days. I will call her family today to provide an update.   Objective: Vital signs in last 24 hours: Vitals:   02/23/19 1710 02/24/19 0116 02/24/19 0510 02/24/19 0519  BP: (!) 146/56 (!) 158/60  (!) 157/56  Pulse: 78 80  78  Resp:  16 15 16   Temp: 98.5 F (36.9 C) 98.3 F (36.8 C)  97.9 F (36.6 C)  TempSrc: Oral Oral  Oral  SpO2: 98% 99%  99%  Weight:   46.5 kg   Height:       General: Elderly female in no acute distress Extremities: Pulses palpable in all extremities, no LE edema  Skin: Warm and dry, senile purpura   Assessment/Plan:  Ms. Clugston is a 85 yo F with hx of dementia, CVA, HTN, and Hypothyroidism who presented from her living facility with tachycardia, recent dark stools suggestive of GI bleed.  GI Bleed: - Tachycardia has improved. She reports no dark BMs  - s/p 1 unit pRBCs her Hgb is 7.2 which is down from 7.6 after her transfusion we will continue to monitor - Discontinued alendronate and plavix  - Discussed plan with Romeo Rabon, son of [name], and discussed the risks and benefits of an endoscopy. It was decided that he wishes for her not to undergo the procedure unless her bleed worsens. - IV PPI. Day #2 - Speech eval then clear liquid diet  - Appreciate GI recs  UTI: - Urine growing Providencia Stuartii,  - Continue Ceftriaxone (Day 3). Stop after today   Hypovolemic Hypernatremia:  - Na 148 today, free water deficit of 1.2  - Start D5W @100  cc per hour  HTN - Holding home losartan  Hx of CVA:  - Holding home plavix given active bleed  Hypothyroidism - Holding home synthroid 50 mcg   Dementia:  - Holding Buspar and Zoloft  - Delterium precautions    Dispo:  Anticipated discharge pending medical improvement.  Ina Homes, MD 02/24/2019, 9:07 AM

## 2019-02-25 LAB — CBC
HCT: 24.5 % — ABNORMAL LOW (ref 36.0–46.0)
Hemoglobin: 8.1 g/dL — ABNORMAL LOW (ref 12.0–15.0)
MCH: 30.6 pg (ref 26.0–34.0)
MCHC: 33.1 g/dL (ref 30.0–36.0)
MCV: 92.5 fL (ref 80.0–100.0)
Platelets: 176 10*3/uL (ref 150–400)
RBC: 2.65 MIL/uL — ABNORMAL LOW (ref 3.87–5.11)
RDW: 15.9 % — ABNORMAL HIGH (ref 11.5–15.5)
WBC: 10.4 10*3/uL (ref 4.0–10.5)
nRBC: 0.2 % (ref 0.0–0.2)

## 2019-02-25 LAB — CULTURE, BLOOD (ROUTINE X 2): Special Requests: ADEQUATE

## 2019-02-25 LAB — BASIC METABOLIC PANEL
Anion gap: 8 (ref 5–15)
BUN: 9 mg/dL (ref 8–23)
CO2: 21 mmol/L — ABNORMAL LOW (ref 22–32)
Calcium: 7.3 mg/dL — ABNORMAL LOW (ref 8.9–10.3)
Chloride: 104 mmol/L (ref 98–111)
Creatinine, Ser: 0.5 mg/dL (ref 0.44–1.00)
GFR calc Af Amer: >60 mL/min (ref >60)
GFR calc non Af Amer: >60 mL/min (ref >60)
Glucose, Bld: 110 mg/dL — ABNORMAL HIGH (ref 70–99)
Potassium: 3.2 mmol/L — ABNORMAL LOW (ref 3.5–5.1)
Sodium: 133 mmol/L — ABNORMAL LOW (ref 135–145)

## 2019-02-25 LAB — SARS CORONAVIRUS 2 BY RT PCR (HOSPITAL ORDER, PERFORMED IN ~~LOC~~ HOSPITAL LAB): SARS Coronavirus 2: NEGATIVE

## 2019-02-25 MED ORDER — PANTOPRAZOLE SODIUM 40 MG PO TBEC: 40.0000 mg | DELAYED_RELEASE_TABLET | Freq: Two times a day (BID) | ORAL | 3 refills | Status: AC

## 2019-02-25 MED ORDER — PANTOPRAZOLE SODIUM 40 MG PO TBEC
40.0000 mg | DELAYED_RELEASE_TABLET | Freq: Two times a day (BID) | ORAL | Status: DC
  Administered 2019-02-25 – 2019-02-26 (×2): 40 mg via ORAL
  Filled 2019-02-25 (×2): qty 1

## 2019-02-25 NOTE — Progress Notes (Signed)
  Date: 02/25/2019  Patient name: Patricia Gilbert  Medical record number: [id number]  Date of birth: 1926/03/16        I have seen and evaluated this patient and I have discussed the plan of care with the house staff. Please see their note for complete details. I concur with their findings with the following additions/corrections: Hospice or palliative care at her facility likely would be beneficial.  Bartholomew Crews, MD 02/25/2019, 2:22 PM

## 2019-02-25 NOTE — Progress Notes (Signed)
Patient is a resident of [facility]. Telephone number: [phone].

## 2019-02-25 NOTE — Discharge Summary (Addendum)
Name: Patricia Gilbert MRN: [id number] DOB: 1926-01-31 85 y.o. PCP: Patient, No Pcp Per  Date of Admission: 02/22/2019  1:51 PM Date of Discharge: 02/26/2019 Attending Physician: Bartholomew Crews, MD  Discharge Diagnosis: 1. Upper GI Bleed  2. Hx of CVA  Discharge Medications: Allergies as of 02/26/2019      Reactions   Aspirin    Lactose Intolerance (gi)    Codeine Palpitations      Medication List    STOP taking these medications   alendronate 70 MG tablet Commonly known as: FOSAMAX   clopidogrel 75 MG tablet Commonly known as: PLAVIX     TAKE these medications   acetaminophen 325 MG tablet Commonly known as: TYLENOL Take 1-2 tablets (325-650 mg total) by mouth every 6 (six) hours as needed for mild pain (pain score 1-3 or temp > 100.5).   atorvastatin 20 MG tablet Commonly known as: LIPITOR Take 1 tablet (20 mg total) by mouth daily at 6 PM. What changed: when to take this   busPIRone 7.5 MG tablet Commonly known as: BUSPAR Take 7.5 mg by mouth 2 (two) times daily.   docusate sodium 100 MG capsule Commonly known as: COLACE Take 1 capsule (100 mg total) by mouth 2 (two) times daily.   levothyroxine 50 MCG tablet Commonly known as: SYNTHROID Take 1 tablet (50 mcg total) by mouth daily.   losartan 25 MG tablet Commonly known as: COZAAR Take 25 mg by mouth daily.   miconazole 2 % vaginal cream Commonly known as: MONISTAT 7 Place 1 Applicatorful vaginally at bedtime.   pantoprazole 40 MG tablet Commonly known as: PROTONIX Take 1 tablet (40 mg total) by mouth 2 (two) times daily.   senna 8.6 MG Tabs tablet Commonly known as: SENOKOT Take 2 tablets by mouth daily.   sertraline 25 MG tablet Commonly known as: ZOLOFT Take 25 mg by mouth daily.   zinc oxide 20 % ointment Apply 1 application topically See admin instructions. Apply to Groin every shift   zinc sulfate 220 (50 Zn) MG capsule Take 220 mg by mouth daily.     Disposition and  follow-up:   Ms.Patricia Gilbert was discharged from [facility] in Stable condition.  At the hospital follow up visit please address:  1. Upper GI Bleed. Both the patient's Fosamax and Plavix have been stopped. These medication should not be resumed. Please check a CBC in one week.   2.  Labs / imaging needed at time of follow-up: CBC in 1 week  3.  Pending labs/ test needing follow-up: None  Follow-up Appointments: Contact information for after-discharge care    Destination    [facility] .   Service: Skilled Nursing Contact information: [address] [place] [phone]            Will follow up at her living facility.   Hospital Course by problem list:  1. Upper GI Bleed. Patricia Gilbert is a 85 year old female with dementia, osteoporosis, and prior CVA who presented to the emergency department member skilled nursing facility with tachycardia and melanotic stools. She was found to have an acute anemia with a hemoglobin of 6.9. FOBT is positive. She was subsequently transfused one unit of packed red cells and G.I. was consulted for possible EGD. G.I. recommended consideration of an EGD after discontinuing the patient's Plavix for five days. There is concerns for esophagitis versus PUD. They also recommended discontinuing her  Fosamax. She was started on IV PPI in both her Plavix and Fosamax were discontinued. Her hemoglobin subsequently trended up. We discussed goals of care with the patient's son Patricia Gilbert who agreed to not pursue an EGD unless the patient has recurrence of her bleeding. He will continue to hold her Plavix and Fosamax indefinitely. She was discharged in stable condition.   2. Hx of CVA. Patient with prior CVA. She was on Plavix. This medication was discontinued given her admission for an upper G.I. bleed. We discussed the risk/benefits of permanently discontinuing this medication  with her son Patricia Gilbert. He voiced understanding and agreed with the decision to permanently discontinue her Plavix.  Discharge Vitals:   BP (!) 160/78 (BP Location: Right Arm)   Pulse 100   Temp 99.6 F (37.6 C) (Oral)   Resp 18   Ht 5\' 1"  (1.549 m)   Wt 46.5 kg   SpO2 96%   BMI 19.37 kg/m   Pertinent Labs, Studies, and Procedures:  CBC Latest Ref Rng & Units 02/25/2019 02/24/2019 02/24/2019  WBC 4.0 - 10.5 K/uL 10.4 10.1 9.5  Hemoglobin 12.0 - 15.0 g/dL 8.1(L) 7.9(L) 7.2(L)  Hematocrit 36.0 - 46.0 % 24.5(L) 24.5(L) 22.9(L)  Platelets 150 - 400 K/uL 176 177 177   Discharge Instructions: Discharge Instructions    Diet - low sodium heart healthy   Complete by: As directed    Increase activity slowly   Complete by: As directed     Signed: Maudie Mercury, MD 02/26/2019, 11:19 AM

## 2019-02-25 NOTE — NC FL2 (Addendum)
  [place] LEVEL OF CARE SCREENING TOOL     IDENTIFICATION  Patient Name: Patricia Gilbert Birthdate: 04-Mar-1926 Sex: female Admission Date (Current Location): 02/22/2019  [facility] and [place] Number:  Herbalist and Address:  [facility], [place] [address] [place], [place] [address]      Provider Number: [id number]  Attending Physician Name and Address:  Bartholomew Crews, MD  Relative Name and Phone Number:       Current Level of Care: Hospital Recommended Level of Care: [place] Prior Approval Number:    Date Approved/Denied:   PASRR Number: [id number]  Discharge Plan: SNF    Current Diagnoses: Patient Active Problem List   Diagnosis Date Noted  . Symptomatic anemia 02/23/2019  . GI bleed 02/22/2019  . CVA (cerebral vascular accident) ([place]) 12/13/2018  . HTN (hypertension) 01/02/2018  . Hip fracture ([place]) 12/31/2017  . Left displaced femoral neck fracture ([place]) 12/31/2017  . Wound of left leg 03/13/2015  . Osteoporosis, senile 01/09/2013  . Allergic rhinitis, cause unspecified   . Hypothyroid   . Mild dementia ([place])   . Syncope 08/20/2011  . Sinus tachycardia 08/20/2011    Orientation RESPIRATION BLADDER Height & Weight     Self  Normal Incontinent Weight: 102 lb 8.2 oz (46.5 kg) Height:  5\' 1"  (154.9 cm)  BEHAVIORAL SYMPTOMS/MOOD NEUROLOGICAL BOWEL NUTRITION STATUS        Diet(see DC summary)  AMBULATORY STATUS COMMUNICATION OF NEEDS Skin   Extensive Assist Verbally Normal                       Personal Care Assistance Level of Assistance  Bathing, Dressing, Feeding Bathing Assistance: Maximum assistance Feeding assistance: Limited assistance Dressing Assistance: Maximum assistance     Functional Limitations Info             SPECIAL CARE FACTORS FREQUENCY                       Contractures      Additional Factors Info  Code Status, Allergies Code Status Info:  DNR Allergies Info: Aspirin Lactose Intolerance (Gi) Codeine           Current Medications (02/25/2019):  This is the current hospital active medication list Current Facility-Administered Medications  Medication Dose Route Frequency Provider Last Rate Last Dose  . pantoprazole (PROTONIX) EC tablet 40 mg  40 mg Oral BID Helberg, Justin, MD      . sodium chloride flush (NS) 0.9 % injection 3 mL  3 mL Intravenous Q12H Neva Seat, MD   3 mL at 02/25/19 1021  . zinc oxide 20 % ointment 1 application  1 application Topical BID Neva Seat, MD   1 application at [id number]     Discharge Medications: Please see discharge summary for a list of discharge medications.  Relevant Imaging Results:  Relevant Lab Results:   Additional [place], LCSW

## 2019-02-25 NOTE — Progress Notes (Signed)
CSW is aware of discharge orders/ summary for patient- patients facility ([facility]) requires 2 negative Covid tests before accepting patients back. Patient has 1st negative test on 8/27 and 2nd covid test has been ordered.   Once 2nd negative is received patient will be set to return to facility- CSW attempted to notify teaching services but no response.   Kingsley Spittle, LCSW [facility]  [phone]

## 2019-02-25 NOTE — Progress Notes (Signed)
   Subjective: [name] reports she is doing okay this AM and slept great. During conversation, she seemed tired though. She reports she always eat.   Discussed plan to speak with son about discharge. No questions or concerns at this time.   I called and spoke with the son Patricia Gilbert. We discussed permanent discontinuation of plavix given the risks/benefits. He agrees. She is stable for discharge today.   Objective: Vital signs in last 24 hours: Vitals:   02/24/19 0519 02/24/19 1500 02/24/19 2116 02/25/19 0540  BP: (!) 157/56 (!) 155/61 (!) 170/72 (!) 139/50  Pulse: 78 80 94 88  Resp: 16  18 14   Temp: 97.9 F (36.6 C) 97.7 F (36.5 C) 98.1 F (36.7 C) 98.4 F (36.9 C)  TempSrc: Oral Oral Oral Oral  SpO2: 99% 99% 98% 98%  Weight:      Height:       General: Elderly female in no acute distress Extremities: Pulses palpable in all extremities, no LE edema  Skin: Warm and dry, senile purpura   Assessment/Plan:  [name] is a 85 yo F with hx of dementia, CVA, HTN, and Hypothyroidism who presented from her living facility with tachycardia, recent dark stools suggestive of GI bleed.  GI Bleed: - s/p 1 unit pRBCs her Hgb is trending up at 8.1 this AM - Discontinued alendronate and plavix. Permanently.   - Discussed plan with Patricia Gilbert, son of [name], and discussed the risks and benefits of an endoscopy. It was decided that he wishes for her not to undergo the procedure unless her bleed worsens. - Transition to PO PPI BID  UTI: - Urine growing Providencia Stuartii,  - Completed 3 days of ceftriaxone   HTN - Holding home losartan  Hx of CVA:  - Holding home plavix given active bleed  Hypothyroidism - Holding home synthroid 50 mcg   Dementia:  - Holding Buspar and Zoloft  - Delterium precautions   Hypovolemic Hypernatremia. Resolved   Dispo: Anticipated discharge today.  Ina Homes, MD 02/25/2019, 6:53 AM

## 2019-02-26 DIAGNOSIS — B9689 Other specified bacterial agents as the cause of diseases classified elsewhere: Secondary | ICD-10-CM

## 2019-02-26 DIAGNOSIS — N390 Urinary tract infection, site not specified: Secondary | ICD-10-CM

## 2019-02-26 LAB — TYPE AND SCREEN
ABO/RH(D): A NEG
Antibody Screen: NEGATIVE
Unit division: 0
Unit division: 0
Unit division: 0

## 2019-02-26 LAB — BPAM RBC
Blood Product Expiration Date: 202009042359
Blood Product Expiration Date: 202009042359
Blood Product Expiration Date: 202009172359
ISSUE DATE / TIME: 202008271859
ISSUE DATE / TIME: 202008302313
Unit Type and Rh: 600
Unit Type and Rh: 600
Unit Type and Rh: 600

## 2019-02-26 LAB — GLUCOSE, CAPILLARY: Glucose-Capillary: 77 mg/dL (ref 70–99)

## 2019-02-26 NOTE — Progress Notes (Signed)
[facility] able to accept patient today. CSW paged MD for updated DC Summary, signed Fl2, and signed DNR.   Percell Locus Youssouf Shipley LCSW [phone]

## 2019-02-26 NOTE — Progress Notes (Addendum)
   Subjective:  Patricia Gilbert was seen at bedside this morning. She states that she slept fine last night. She states that she is feeling well, and has no complaints. All questions and concerns were addressed.   Objective: Vital signs in last 24 hours: Vitals:   02/25/19 1234 02/25/19 2246 02/26/19 0510 02/26/19 0647  BP: (!) 123/45 (!) 144/56 (!) 158/64 (!) 160/78  Pulse: 90 96 100   Resp: 20  18   Temp: 98.4 F (36.9 C) 98.5 F (36.9 C) 99.6 F (37.6 C)   TempSrc: Axillary Oral Oral   SpO2: 98% 97% 97% 96%  Weight:      Height:       General: Appears well rested and in no acute distress.  Cardiovascular: RRR, No murmurs, rubs, or gallops. Pulmonary: Clear to auscultation bilaterally with no wheezes, rales, or rhonchi.  Extremities: Pulses palpable in all extremities, no LE edema.  Assessment/Plan:   GI Bleed suspected due to upper GI source:  - Hgb: 8.1 - Discontinued alendronate and plavix. Permanently.   - Continue Protonix 40 BID.   UTI: - Urine growing Providencia Stuartii.  - Completed 3 days of ceftriaxone.   HTN - Holding home losartan   Hx of CVA:  - Holding home plavix given active bleed   Hypothyroidism - Holding home synthroid 50 mcg     Dementia:  - Holding Buspar and Zoloft  - Delterium precautions   Hypovolemic Hypernatremia. Resolved   Dispo: Anticipated discharge today.  Patricia Mercury, MD 02/26/2019, 12:49 PM

## 2019-02-26 NOTE — TOC Transition Note (Signed)
Transition of Care [facility]) - CM/SW Discharge Note   Patient Details  Name: Patricia Gilbert MRN: [id number] Date of Birth: [birth year]  [facility] CM/SW Contact:  Benard Halsted, LCSW Phone Number: 02/26/2019, 12:04 PM   Clinical Narrative:    Patient will [facility] to: [facility] Anticipated DC date: 02/26/19 Family notified: Son, Gershon Mussel Transport by: PTA 1:45pm   Per MD patient ready for DC to [facility]). RN, patient, patient's family, and facility notified of DC. Discharge Summary and FL2 sent to facility. RN to call report prior to discharge [phone] Room 16). DC packet on chart. Ambulance transport requested for patient.   CSW will sign off for now as social work intervention is no longer needed. Please consult [place] again if new needs arise.  Cedric Fishman, LCSW Clinical Social Worker [phone]    Final next level of care: Skilled Nursing Facility Barriers to Discharge: No Barriers Identified   Patient Goals and CMS Choice   CMS Medicare.gov Compare Post Acute Care list provided to:: Patient Represenative (must comment) Choice offered to / list presented to : Adult Children  Discharge Placement                       Discharge Plan and Services In-house Referral: Clinical Social Work Discharge Planning Services: NA Post Acute Care Choice: Skilled Nursing Facility          DME Arranged: N/A         HH Arranged: NA          Social Determinants of Health (SDOH) Interventions     Readmission Risk Interventions No flowsheet data found.

## 2019-02-26 NOTE — Care Management Important Message (Signed)
Important Message  Patient Details  Name: Patricia Gilbert MRN: [id number] Date of Birth: 02-02-1926   Medicare Important Message Given:  Yes     Amoria Mclees Montine Circle 02/26/2019, 1:55 PM

## 2019-02-27 LAB — CULTURE, BLOOD (ROUTINE X 2): Culture: NO GROWTH

## 2019-03-29 DEATH — deceased

## 2020-06-16 IMAGING — CR DG HIP (WITH OR WITHOUT PELVIS) 2-3V*L*
3 series · 3 of 3 positions shown · non-contrast
Comparison: None.

CLINICAL DATA: Fall, LEFT hip pain.

EXAM:
DG HIP (WITH OR WITHOUT PELVIS) 2-3V LEFT

[x pelvis]
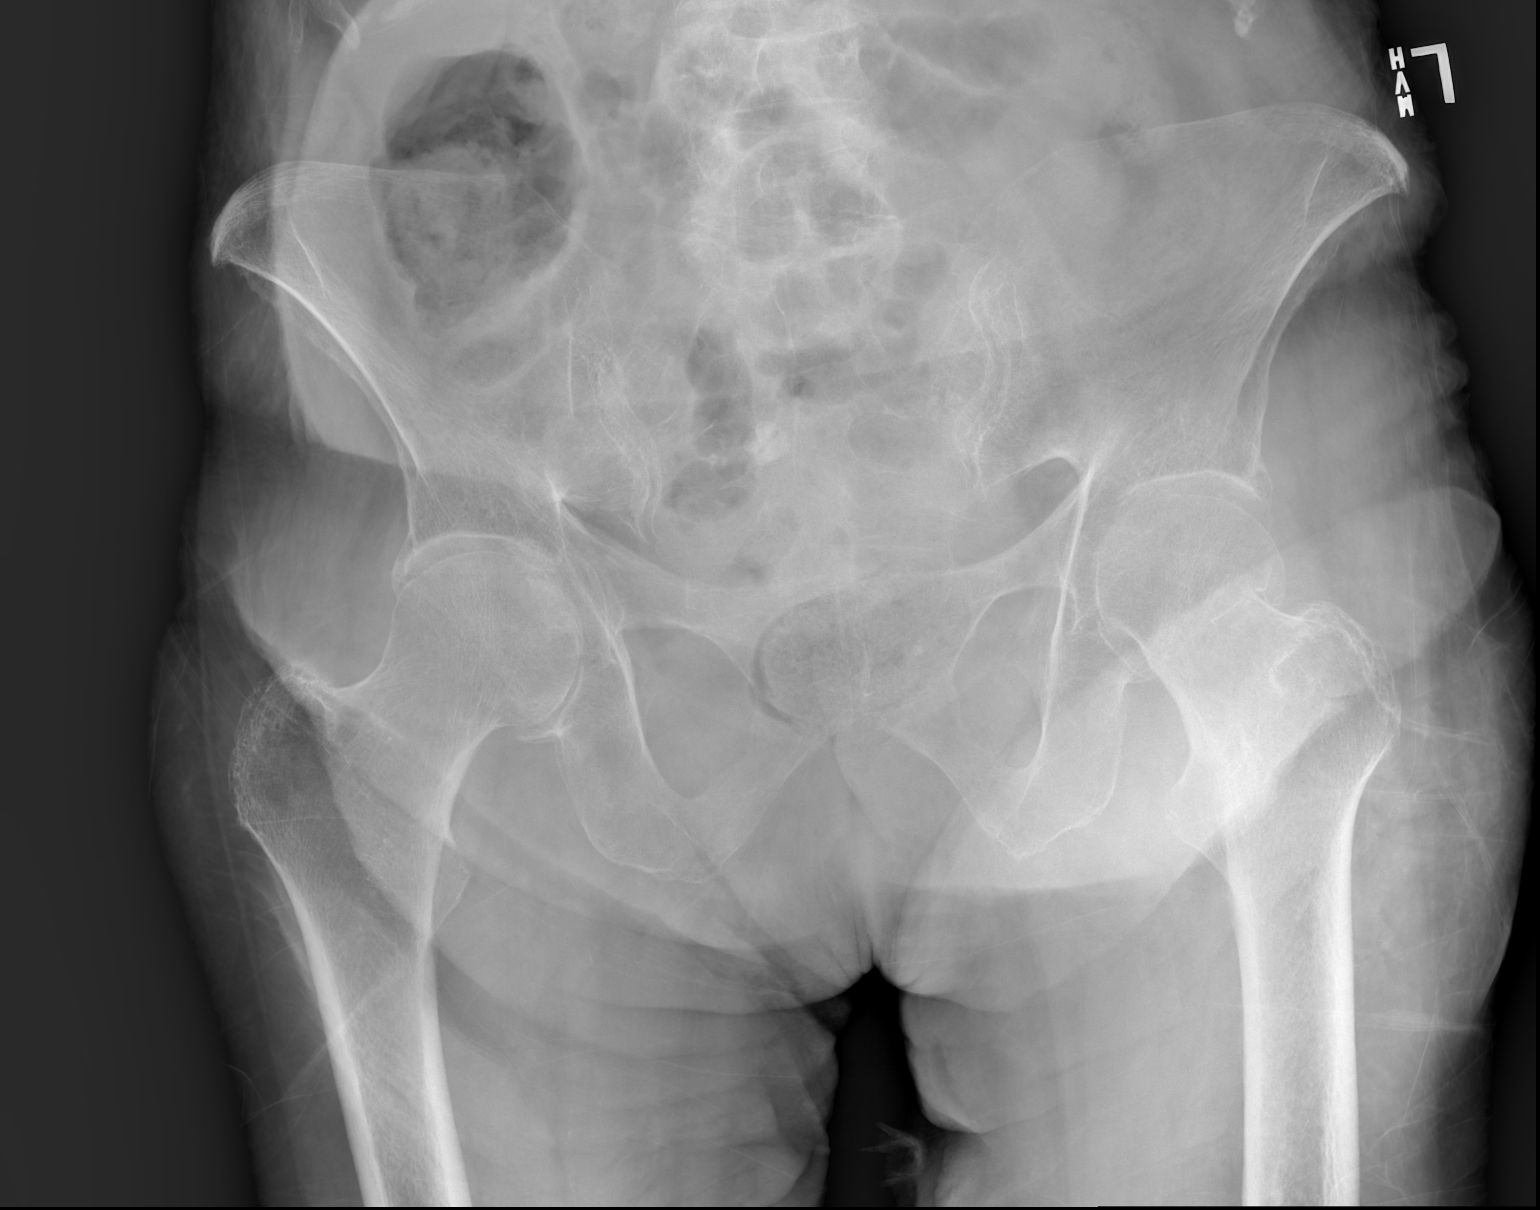

[x hip ap left]
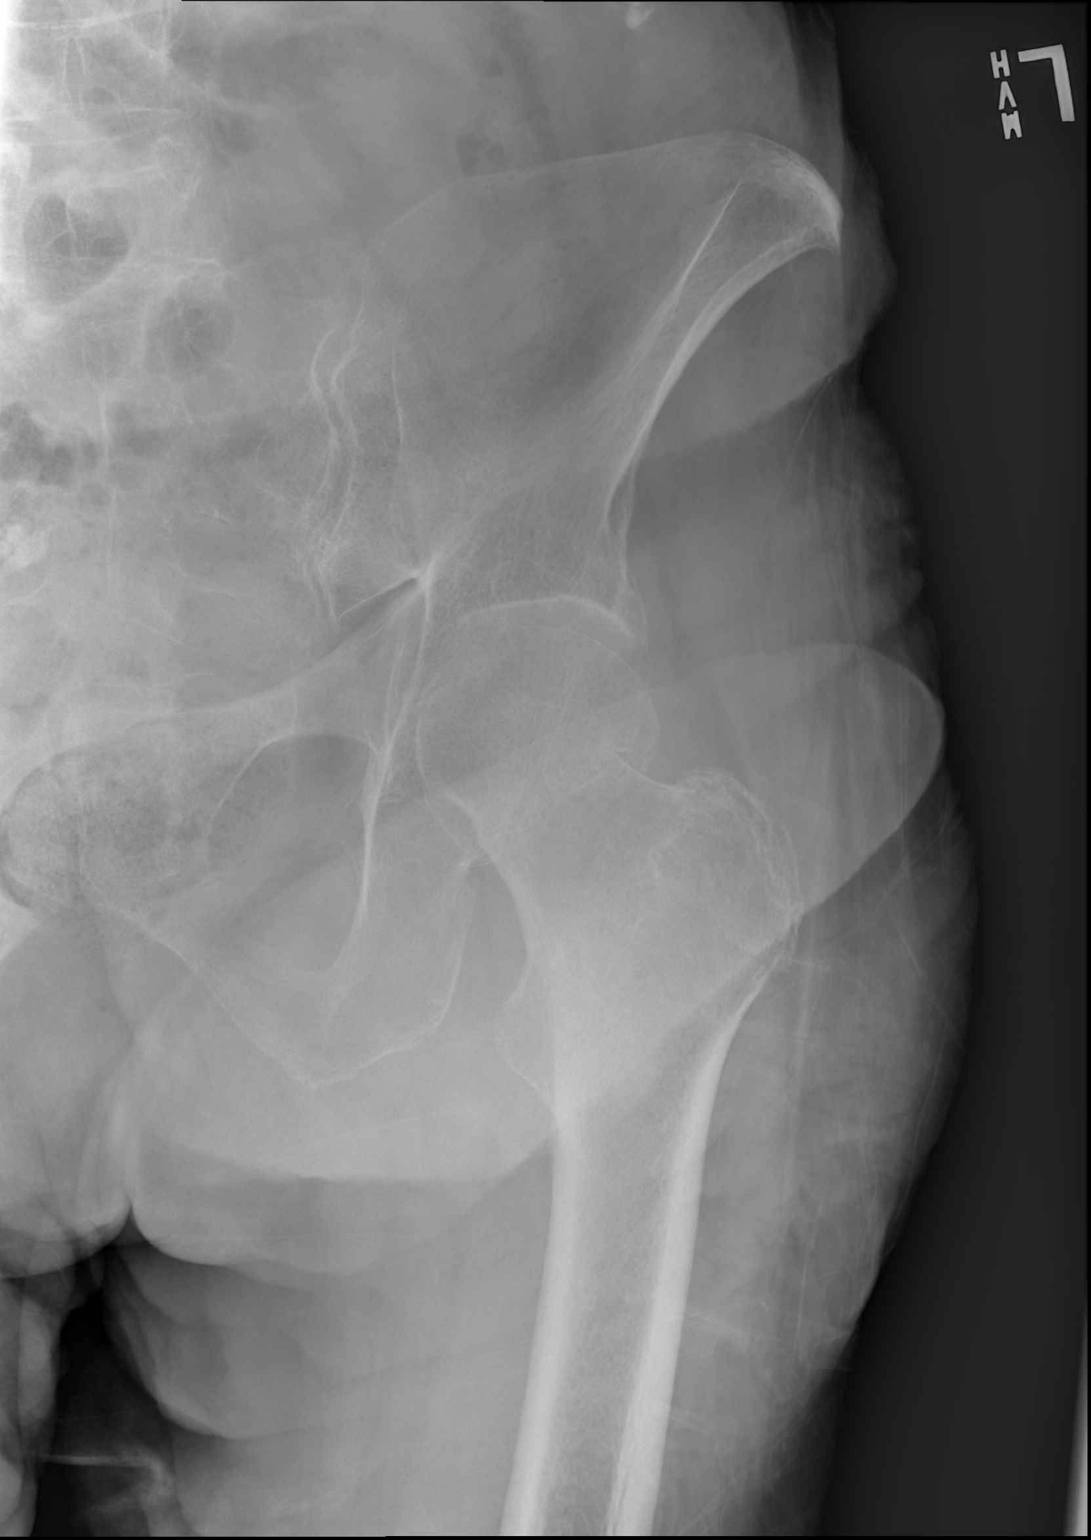

[w hip lat left]
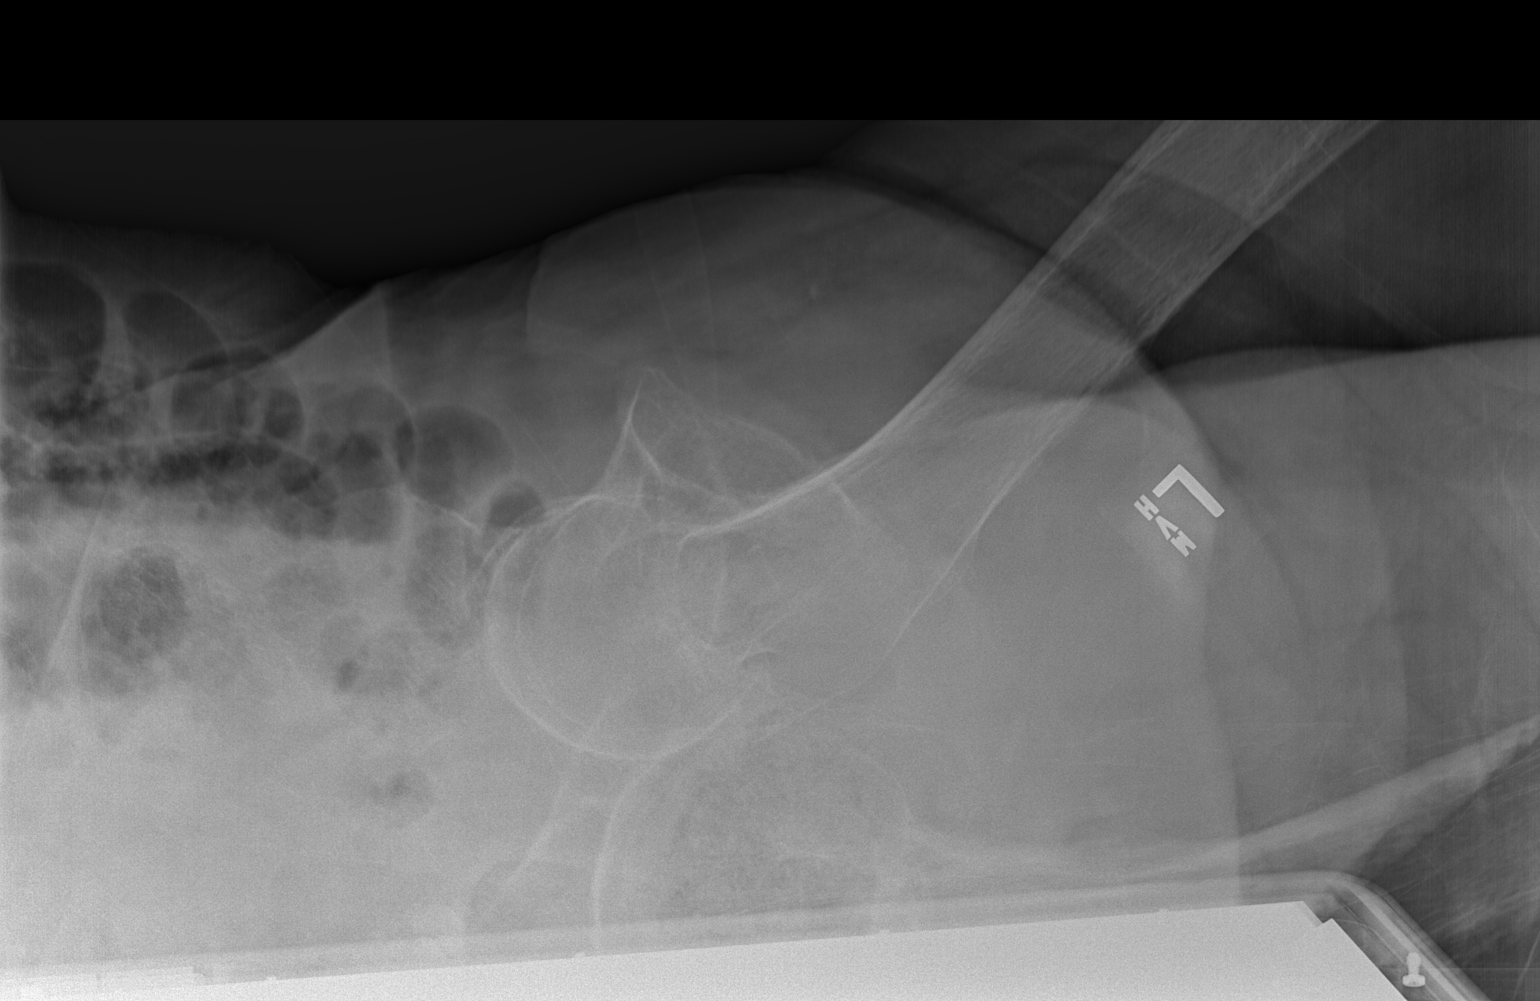

[3 of 3 positions shown; findings below may reference images not displayed]

FINDINGS: There is a slightly displaced fracture of the LEFT femoral neck,
subcapital, with suspected impaction at the fracture site. Femoral
head remains appropriately positioned relative to the acetabulum.

No additional fracture identified within the osseous pelvis or about
the RIGHT hip. Osteopenia limits characterization of osseous detail.
Soft tissues about the LEFT hip are unremarkable.
IMPRESSION: Slightly displaced fracture of the LEFT femoral neck, subcapital
region.

## 2020-06-17 IMAGING — DX DG FINGER THUMB 2+V*L*
3 series · 3 of 3 positions shown · non-contrast
Comparison: None.

CLINICAL DATA: Fall with thumb pain

EXAM:
LEFT THUMB 2+V

[finger ap (1 of 2)]
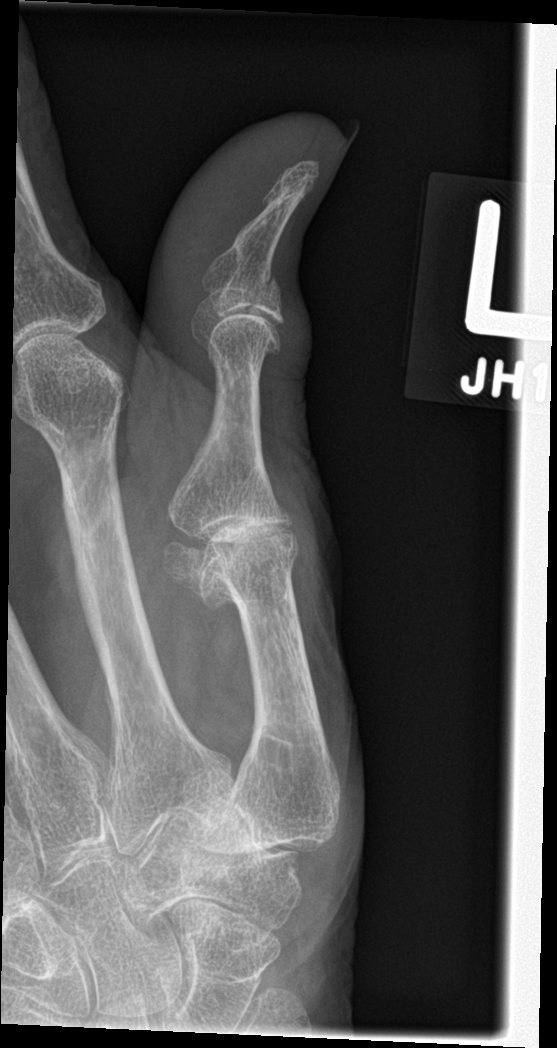

[finger obl]
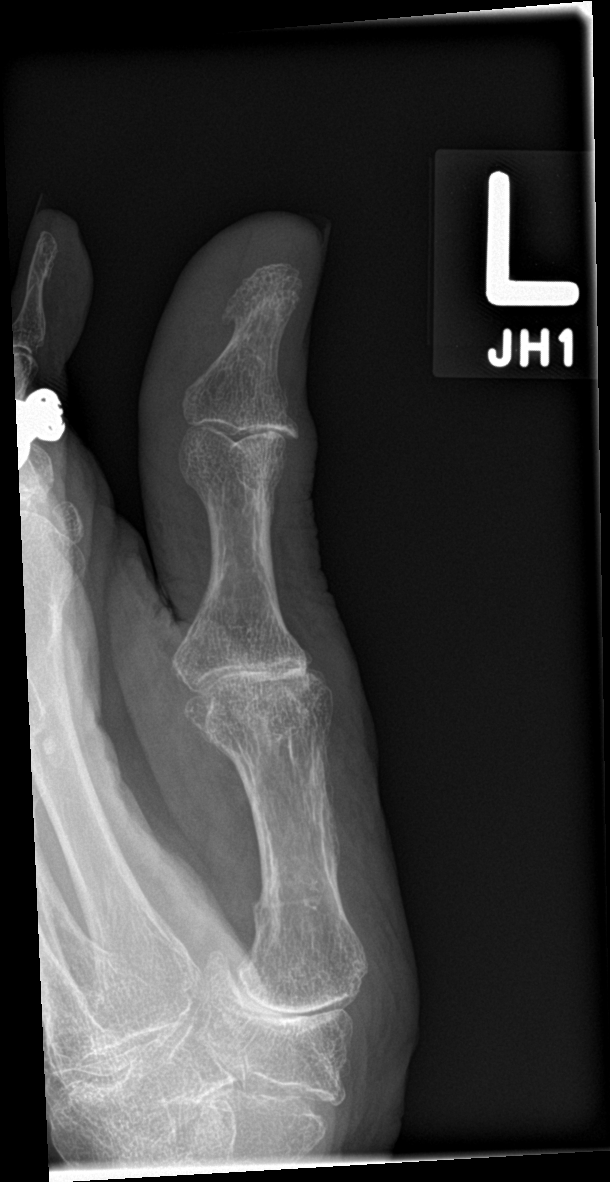

[finger ap (2 of 2)]
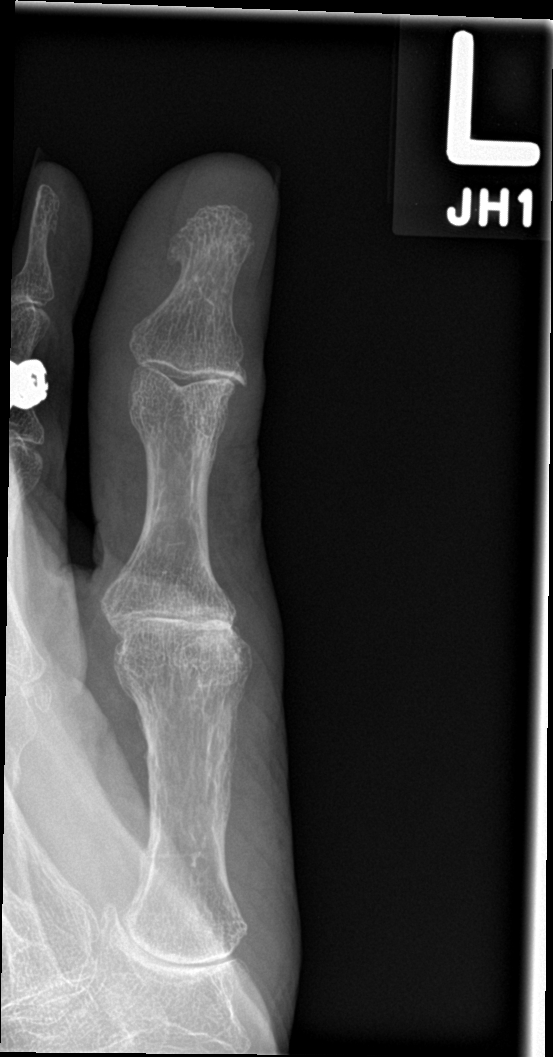

[3 of 3 positions shown; findings below may reference images not displayed]

FINDINGS: Acute nondisplaced intra-articular fracture involving the dorsal
base of the first distal phalanx. No subluxation. Mild degenerative
change at the PIP joint
IMPRESSION: Acute nondisplaced intra-articular fracture involving the base of
the first distal phalanx
